# Patient Record
Sex: Male | Born: 1975 | Race: White | Hispanic: No | Marital: Married | Smoking: Never smoker
Health system: Southern US, Community
[De-identification: ages and names within clinical notes are randomized; demographics above are authoritative.]

## PROBLEM LIST (undated history)

## (undated) DIAGNOSIS — K219 Gastro-esophageal reflux disease without esophagitis: Secondary | ICD-10-CM

## (undated) DIAGNOSIS — J45909 Unspecified asthma, uncomplicated: Secondary | ICD-10-CM

## (undated) HISTORY — PX: OTHER SURGICAL HISTORY: SHX169

## (undated) HISTORY — PX: NASAL SINUS SURGERY: SHX719

## (undated) HISTORY — DX: Gastro-esophageal reflux disease without esophagitis: K21.9

## (undated) HISTORY — PX: SINUS SURGERY WITH INSTATRAK: SHX5215

---

## 2018-05-03 ENCOUNTER — Emergency Department: Payer: BLUE CROSS/BLUE SHIELD

## 2018-05-03 ENCOUNTER — Emergency Department
Admission: EM | Admit: 2018-05-03 | Discharge: 2018-05-03 | Disposition: A | Discharge status: Home / Self Care | Payer: BLUE CROSS/BLUE SHIELD | Attending: Emergency Medicine | Admitting: Emergency Medicine

## 2018-05-03 ENCOUNTER — Encounter: Payer: Self-pay | Admitting: Emergency Medicine

## 2018-05-03 DIAGNOSIS — M79661 Pain in right lower leg: Secondary | ICD-10-CM

## 2018-05-03 DIAGNOSIS — Y9289 Other specified places as the place of occurrence of the external cause: Secondary | ICD-10-CM | POA: Insufficient documentation

## 2018-05-03 DIAGNOSIS — T148XXA Other injury of unspecified body region, initial encounter: Secondary | ICD-10-CM | POA: Diagnosis not present

## 2018-05-03 DIAGNOSIS — X509XXA Other and unspecified overexertion or strenuous movements or postures, initial encounter: Secondary | ICD-10-CM | POA: Insufficient documentation

## 2018-05-03 DIAGNOSIS — Y9301 Activity, walking, marching and hiking: Secondary | ICD-10-CM | POA: Diagnosis not present

## 2018-05-03 DIAGNOSIS — Y998 Other external cause status: Secondary | ICD-10-CM | POA: Diagnosis not present

## 2018-05-03 DIAGNOSIS — M79604 Pain in right leg: Secondary | ICD-10-CM | POA: Diagnosis not present

## 2018-05-03 DIAGNOSIS — J45909 Unspecified asthma, uncomplicated: Secondary | ICD-10-CM | POA: Diagnosis not present

## 2018-05-03 HISTORY — DX: Unspecified asthma, uncomplicated: J45.909

## 2018-05-03 NOTE — Discharge Instructions (Addendum)
Your exam and ultrasound are essentially normal at this time. Your symptoms likely represent a calf muscle strain and a quad muscle  injury. There is no evidence of blood clot (DVT). You may experience muscle cramps for a few days more. Take OTC ibuprofen or naproxen for muscle pain relief. Follow-up with [facility] for ongoing symptoms.

## 2018-05-03 NOTE — ED Triage Notes (Signed)
Patient presents to the ED with cramping to his right thigh last night.  Patient states he is concerned he could have a blood clot.  Patient states he has not had any pain or cramping today.  Patient denies swelling or discoloration in his leg.  Patient denies any history of blood clots.

## 2018-05-03 NOTE — ED Provider Notes (Signed)
[facility] Emergency Department Provider Note ____________________________________________  Time seen: 0300  I have reviewed the triage vital signs and the nursing notes.  HISTORY  Chief Complaint  Leg Pain  HPI Jason Petersen is a 43 y.o. male presents to the ED from [facility]. He has a 2-week complaint of intermittent right calf pain and cramping. He reports a minor injury at onset, when he stepped over a shallow creek, while holding his toddler. He denies a fall, but describes a mild strain to the calf. He got concerned after he developed some muscle pain at the medial quadriceps muscle on the same leg. He presented to [facility] for initial evaluation. He denies any chest pain, shortness of breath, leg pain, edema, or claudication.   Past Medical History:  Diagnosis Date  . Asthma     There are no active problems to display for this patient.   Past Surgical History:  Procedure Laterality Date  . SINUS SURGERY WITH INSTATRAK      Prior to Admission medications   Not on File    Allergies Patient has no known allergies.  No family history on file.  Social History Social History   Tobacco Use  . Smoking status: Never Smoker  . Smokeless tobacco: Never Used  Substance Use Topics  . Alcohol use: Yes    Comment: occasionally  . Drug use: Not on file    Review of Systems  Constitutional: Negative for fever. Cardiovascular: Negative for chest pain. Respiratory: Negative for shortness of breath. Gastrointestinal: Negative for abdominal pain, vomiting and diarrhea. Genitourinary: Negative for dysuria. Musculoskeletal: Negative for back pain. RLE pain as above Skin: Negative for rash. Neurological: Negative for headaches, focal weakness or numbness. ____________________________________________  PHYSICAL EXAM:  VITAL SIGNS: ED Triage Vitals [05/03/18 1102]  Enc Vitals Group     BP (!) 157/82     Pulse Rate 92     Resp 18     Temp 98.9 F (37.2  C)     Temp Source Oral     SpO2 96 %     Weight 250 lb (113.4 kg)     Height 5\' 11"  (1.803 m)     Head Circumference      Peak Flow      Pain Score 0     Pain Loc      Pain Edu?      Excl. in [place]?     Constitutional: Alert and oriented. Well appearing and in no distress. Head: Normocephalic and atraumatic. Eyes: Conjunctivae are normal. Normal extraocular movements Cardiovascular: Normal rate, regular rhythm. Normal distal pulses. No cyanosis, clubbing, or edema noted distally Respiratory: Normal respiratory effort. No wheezes/rales/rhonchi. Musculoskeletal: RLE without edema, ecchymosis, or skin temp changes. Minimal calf tenderness at the achilles origin. No palpable muscle defects. Nontender with normal range of motion in all extremities.  Neurologic:  CN II-XII grossly intact. Normal gait without ataxia. Normal speech and language. No gross focal neurologic deficits are appreciated. Skin:  Skin is warm, dry and intact. No rash noted. Psychiatric: Mood and affect are normal. Patient exhibits appropriate insight and judgment. ____________________________________________   RADIOLOGY  RLE US Doppler  IMPRESSION: No evidence of right lower extremity deep venous thrombosis. ____________________________________________  PROCEDURES  Procedures ____________________________________________  INITIAL IMPRESSION / ASSESSMENT AND PLAN / ED COURSE  Patient with ED evaluation of RLE pain s/p minor mechanical injury. He is reassured by his negative doppler study. He is discharged to select & follow-up with a primary care  provider for routine medical care. Return precautions have been reviewed.  ____________________________________________  FINAL CLINICAL IMPRESSION(S) / ED DIAGNOSES  Final diagnoses:  Right leg pain  Right calf pain  Muscle strain     [name], PA-C 05/03/18 [place], [place], MD 05/04/18 [phone]

## 2018-05-03 NOTE — ED Notes (Signed)
See triage note  Presents with pain to right leg  States he developed cramping type pain to lower leg   And then moved to upper leg unsure of injury but states he did step wrong about 1-1/2 weeks ago  But did not fall  Denies any pain while sitting at present

## 2018-10-22 ENCOUNTER — Other Ambulatory Visit: Payer: Self-pay | Admitting: Family Medicine

## 2018-10-22 ENCOUNTER — Other Ambulatory Visit: Payer: Self-pay | Admitting: Student

## 2018-10-22 DIAGNOSIS — N5089 Other specified disorders of the male genital organs: Secondary | ICD-10-CM

## 2018-10-30 ENCOUNTER — Other Ambulatory Visit: Payer: Self-pay

## 2018-10-30 ENCOUNTER — Ambulatory Visit
Admission: RE | Admit: 2018-10-30 | Discharge: 2018-10-30 | Disposition: A | Discharge status: Home / Self Care | Payer: BC Managed Care – PPO | Source: Ambulatory Visit | Attending: Family Medicine | Admitting: Family Medicine

## 2018-10-30 DIAGNOSIS — N5089 Other specified disorders of the male genital organs: Secondary | ICD-10-CM | POA: Diagnosis present

## 2018-11-12 DIAGNOSIS — K219 Gastro-esophageal reflux disease without esophagitis: Secondary | ICD-10-CM | POA: Insufficient documentation

## 2018-11-12 DIAGNOSIS — R81 Glycosuria: Secondary | ICD-10-CM | POA: Insufficient documentation

## 2018-11-12 DIAGNOSIS — J4530 Mild persistent asthma, uncomplicated: Secondary | ICD-10-CM | POA: Insufficient documentation

## 2019-02-15 ENCOUNTER — Ambulatory Visit: Payer: BC Managed Care – PPO | Admitting: Urology

## 2019-02-22 ENCOUNTER — Other Ambulatory Visit: Payer: Self-pay | Admitting: Family Medicine

## 2019-02-22 ENCOUNTER — Other Ambulatory Visit: Payer: Self-pay

## 2019-02-22 ENCOUNTER — Ambulatory Visit (INDEPENDENT_AMBULATORY_CARE_PROVIDER_SITE_OTHER): Payer: Self-pay | Admitting: Urology

## 2019-02-22 ENCOUNTER — Other Ambulatory Visit
Admission: RE | Admit: 2019-02-22 | Discharge: 2019-02-22 | Disposition: A | Discharge status: Home / Self Care | Payer: Self-pay | Attending: Urology | Admitting: Urology

## 2019-02-22 ENCOUNTER — Encounter: Payer: Self-pay | Admitting: Urology

## 2019-02-22 VITALS — BP 141/84 | HR 74 | Ht 71.0 in | Wt 226.0 lb

## 2019-02-22 DIAGNOSIS — N411 Chronic prostatitis: Secondary | ICD-10-CM

## 2019-02-22 DIAGNOSIS — N4889 Other specified disorders of penis: Secondary | ICD-10-CM

## 2019-02-22 DIAGNOSIS — N644 Mastodynia: Secondary | ICD-10-CM

## 2019-02-22 LAB — URINALYSIS, COMPLETE (UACMP) WITH MICROSCOPIC
Bilirubin Urine: NEGATIVE
Glucose, UA: NEGATIVE mg/dL
Hgb urine dipstick: NEGATIVE
Ketones, ur: NEGATIVE mg/dL
Leukocytes,Ua: NEGATIVE
Nitrite: NEGATIVE
Protein, ur: NEGATIVE mg/dL
RBC / HPF: NONE SEEN RBC/hpf (ref 0–5)
Specific Gravity, Urine: 1.01 (ref 1.005–1.030)
pH: 5.5 (ref 5.0–8.0)

## 2019-02-22 MED ORDER — TAMSULOSIN HCL 0.4 MG PO CAPS: 0.4000 mg | ORAL_CAPSULE | Freq: Every day | ORAL | 2 refills | Status: DC

## 2019-02-22 MED ORDER — TAMSULOSIN HCL 0.4 MG PO CAPS: 0.4000 mg | ORAL_CAPSULE | Freq: Every day | ORAL | 0 refills | Status: DC

## 2019-02-22 NOTE — Progress Notes (Signed)
02/22/2019 8:41 AM   Jason Petersen [birth year] [id number]  Referring provider: Derinda Late, MD [phone] [address] [facility] [facility] [place],  [place] [id number]  No chief complaint on file.   HPI: 44 year old male who presents today for further evaluation of possible prostatitis and right pelvic pain.  He reports that about two months ago, he developed fairly significant right groin pain radiating down his inguinal area to his right perineum.  He also has some discomfort radiating to his hip.  The pain waxed and waned but never completely resolved.  No alleviating or exacerbating factors.  He had a scrotal ultrasound in December which is unremarkable other than a very small right hydrocele.  He has had several urinalyses all of which have been completely negative.  He was recently seen and evaluated in urgent care and prescribed Cipro which he has been taking twice daily for a total of 30 days.  He has 1 or 2 more doses of this.  He reports overall, his pain is improving.  He does still have some residual dull ache in his perineum but this is markedly improved.  He has no associated urinary symptoms at this time.  He does mention today that he had what he feels like some trauma with intercourse and has some discomfort with erections and penetration.  During the traumatic intercourse, he denied any bruising or swelling, loss of erection.  He has no penile curvature.  He wonders if this may have been an inciting event to his perineal discomfort.   Notably, he is also being evaluated for back pain.  He recently had x-rays for this.  He was prescribed meloxicam but has not yet started this medication.  He denies any numbness, weakness, or pain radiating down his thighs.   PMH: Past Medical History:  Diagnosis Date  . Asthma     Surgical History: Past Surgical History:  Procedure Laterality Date  . NASAL SINUS SURGERY    . SINUS SURGERY WITH  INSTATRAK    . tubs in ears      Home Medications:  Allergies as of 02/22/2019   No Known Allergies     Medication List       Accurate as of February 22, 2019 11:59 PM. If you have any questions, ask your nurse or doctor.        beclomethasone 80 MCG/ACT inhaler Commonly known as: QVAR Inhale into the lungs.   ciprofloxacin 500 MG tablet Commonly known as: CIPRO Take by mouth.   meloxicam 15 MG tablet Commonly known as: MOBIC Take 15 mg by mouth daily.   omeprazole 40 MG capsule Commonly known as: PRILOSEC Take by mouth.   tamsulosin 0.4 MG Caps capsule Commonly known as: Flomax Take 1 capsule (0.4 mg total) by mouth daily. Started by: [name], MD   tamsulosin 0.4 MG Caps capsule Commonly known as: Flomax Take 1 capsule (0.4 mg total) by mouth daily. Started by: [name], MD       Allergies: No Known Allergies  Family History: No family history on file.  Social History:  reports that he has never smoked. He has never used smokeless tobacco. He reports current alcohol use. No history on file for drug.  ROS: UROLOGY Frequent Urination?: Yes Hard to postpone urination?: No Burning/pain with urination?: No Get up at night to urinate?: No Leakage of urine?: No Urine stream starts and stops?: No Trouble starting stream?: No Do you have to strain to  urinate?: No Blood in urine?: No Urinary tract infection?: No Sexually transmitted disease?: No Injury to kidneys or bladder?: No Painful intercourse?: Yes Weak stream?: No Erection problems?: Yes Penile pain?: No  Gastrointestinal Nausea?: No Vomiting?: No Indigestion/heartburn?: No Diarrhea?: No Constipation?: No  Constitutional Fever: No Night sweats?: No Weight loss?: No Fatigue?: No  Skin Skin rash/lesions?: No Itching?: No  Eyes Blurred vision?: No Double vision?: No  Ears/Nose/Throat Sore throat?: No Sinus problems?: No  Hematologic/Lymphatic Swollen glands?:  No Easy bruising?: No  Cardiovascular Leg swelling?: No Chest pain?: No  Respiratory Cough?: No Shortness of breath?: No  Endocrine Excessive thirst?: No  Musculoskeletal Back pain?: No Joint pain?: No  Neurological Headaches?: No Dizziness?: No  Psychologic Depression?: No Anxiety?: No  Physical Exam: BP (!) 141/84   Pulse 74   Ht 5\' 11"  (1.803 m)   Wt 226 lb (102.5 kg)   BMI 31.52 kg/m   Constitutional:  Alert and oriented, No acute distress. HEENT: [place] AT, moist mucus membranes.  Trachea midline, no masses. Cardiovascular: No clubbing, cyanosis, or edema. Respiratory: Normal respiratory effort, no increased work of breathing. GI: Abdomen is soft, nontender, nondistended, no abdominal masses.  No evidence on inguinal hernia with valsalva when examined in standing position. GU: Normal circumcised phallus, bilateral descended testicles without masses, nontender.   Rectal: Normal sphincter tone, 20 cc prostate, nontender, no masses Skin: No rashes, bruises or suspicious lesions. Neurologic: Grossly intact, no focal deficits, moving all 4 extremities. Psychiatric: Normal mood and affect.  Urinalysis    Component Value Date/Time   COLORURINE YELLOW 02/22/2019 1008   APPEARANCEUR CLEAR 02/22/2019 1008   LABSPEC 1.010 02/22/2019 1008   PHURINE 5.5 02/22/2019 1008   GLUCOSEU NEGATIVE 02/22/2019 1008   HGBUR NEGATIVE 02/22/2019 1008   BILIRUBINUR NEGATIVE 02/22/2019 1008   KETONESUR NEGATIVE 02/22/2019 1008   PROTEINUR NEGATIVE 02/22/2019 1008   NITRITE NEGATIVE 02/22/2019 1008   LEUKOCYTESUR NEGATIVE 02/22/2019 1008    Lab Results  Component Value Date   BACTERIA FEW (A) 02/22/2019    Pertinent Imaging: CLINICAL DATA:  RIGHT testicular lump for 1 week  EXAM: SCROTAL ULTRASOUND  DOPPLER ULTRASOUND OF THE TESTICLES  TECHNIQUE: Complete ultrasound examination of the testicles, epididymis, and other scrotal structures was performed. Color and  spectral Doppler ultrasound were also utilized to evaluate blood flow to the testicles.  COMPARISON:  None  FINDINGS: Right testicle  Measurements: 4.2 x 2.4 x 2.8 cm. Normal echogenicity without mass or calcification. Internal blood flow present on color Doppler imaging.  Left testicle  Measurements: 4.3 x 2.3 x 2.8 cm. Normal morphology without mass or calcification. Internal blood flow present on color Doppler imaging, symmetric with RIGHT.  Right epididymis:  Normal in size and appearance.  Left epididymis:  Normal in size and appearance.  Hydrocele:  Very small RIGHT hydrocele.  No LEFT hydrocele.  Varicocele:  None visualized.  Pulsed Doppler interrogation of both testes demonstrates normal low resistance arterial and venous waveforms bilaterally.  IMPRESSION: Very small RIGHT hydrocele.  Otherwise normal exam.   Electronically Signed   By: Lavonia Dana M.D.   On: 10/30/2018 14:14  Scrotal ultrasound personally reviewed, agree with radiologic interpretetion  Assessment & Plan:    1. Chronic prostatitis Improving symptoms s/p cipro x 4 weeks  Recommend continued supportive care  Patient would likely benefit from initiation of meloxican as prescribed by PCP along with short course of Flomax until his symptoms have fully resolved  Although his pain laterize to  the right, no other neurological symptoms therefore do no suspect neuropathy, however, may pursue further eval if symptoms worsen or recur   UA reassuring - Urinalysis, Complete; Future  2. Penile pain No evidence of penile trauma or nodules, reassuring     [name], MD  [facility] [address] [place], [place] [address] [phone]

## 2019-03-04 ENCOUNTER — Ambulatory Visit: Payer: BC Managed Care – PPO | Attending: Internal Medicine

## 2019-03-04 DIAGNOSIS — Z20822 Contact with and (suspected) exposure to covid-19: Secondary | ICD-10-CM

## 2019-03-05 ENCOUNTER — Ambulatory Visit
Admission: RE | Admit: 2019-03-05 | Discharge: 2019-03-05 | Disposition: A | Discharge status: Home / Self Care | Payer: BC Managed Care – PPO | Source: Ambulatory Visit | Attending: Family Medicine | Admitting: Family Medicine

## 2019-03-05 DIAGNOSIS — N644 Mastodynia: Secondary | ICD-10-CM | POA: Diagnosis present

## 2019-03-05 LAB — NOVEL CORONAVIRUS, NAA: SARS-CoV-2, NAA: NOT DETECTED

## 2019-03-13 ENCOUNTER — Ambulatory Visit: Payer: BC Managed Care – PPO | Attending: Internal Medicine

## 2019-03-13 DIAGNOSIS — Z20822 Contact with and (suspected) exposure to covid-19: Secondary | ICD-10-CM

## 2019-03-14 LAB — NOVEL CORONAVIRUS, NAA: SARS-CoV-2, NAA: NOT DETECTED

## 2019-04-16 ENCOUNTER — Ambulatory Visit (INDEPENDENT_AMBULATORY_CARE_PROVIDER_SITE_OTHER): Payer: BC Managed Care – PPO | Admitting: Physician Assistant

## 2019-04-16 ENCOUNTER — Other Ambulatory Visit: Payer: Self-pay

## 2019-04-16 ENCOUNTER — Ambulatory Visit: Payer: Medicaid Other | Admitting: Urology

## 2019-04-16 ENCOUNTER — Encounter: Payer: Self-pay | Admitting: Physician Assistant

## 2019-04-16 VITALS — BP 129/84 | HR 73 | Ht 71.0 in | Wt 225.0 lb

## 2019-04-16 DIAGNOSIS — G8929 Other chronic pain: Secondary | ICD-10-CM

## 2019-04-16 DIAGNOSIS — R1031 Right lower quadrant pain: Secondary | ICD-10-CM | POA: Diagnosis not present

## 2019-04-16 LAB — URINALYSIS, COMPLETE
Bilirubin, UA: NEGATIVE
Glucose, UA: NEGATIVE
Ketones, UA: NEGATIVE
Leukocytes,UA: NEGATIVE
Nitrite, UA: NEGATIVE
Protein,UA: NEGATIVE
Specific Gravity, UA: 1.015 (ref 1.005–1.030)
Urobilinogen, Ur: 0.2 mg/dL (ref 0.2–1.0)
pH, UA: 5 (ref 5.0–7.5)

## 2019-04-16 LAB — MICROSCOPIC EXAMINATION
Bacteria, UA: NONE SEEN
Epithelial Cells (non renal): NONE SEEN /hpf (ref 0–10)

## 2019-04-16 NOTE — Patient Instructions (Signed)
Pelvic Pain, Male  Pelvic pain is pain in your lower abdomen, below your belly button and between your hips. The pain may start suddenly (be acute), keep coming back (recur), or last a long time (become chronic). Pelvic pain that lasts longer than six months is considered chronic. There are many possible causes of pelvic pain. Sometimes, the cause is not known.  Pelvic pain may affect your:  · Prostate gland.  · Urinary system.  · Digestive tract.  · Musculoskeletal system. Strained muscles or ligaments may cause pelvic pain.  Follow these instructions at home:    Medicines  · Take over-the-counter and prescription medicines only as told by your health care provider.  · If you were prescribed an antibiotic medicine, take it as told by your health care provider. Do not stop taking the antibiotic even if you start to feel better.  Managing pain, stiffness, and swelling    · Take warm water baths (sitz baths). Sitz baths help with relaxing your pelvic floor muscles.  ? For a sitz bath, the water only comes up to your hips and covers your buttocks. A sitz bath may done at home in a bathtub or with a portable sitz bath that fits over the toilet.  · If directed, apply heat to the affected area before you exercise. Use the heat source that your health care provider recommends, such as a moist heat pack or a heating pad.  ? Place a towel between your skin and the heat source.  ? Leave the heat on for 20-30 minutes.  ? Remove the heat if your skin turns bright red. This is especially important if you are unable to feel pain, heat, or cold. You may have a greater risk of getting burned.  General instructions  · Rest as told by your health care provider.  · Keep a journal of your pelvic pain. Write down:  ? When the pain started.  ? Where the pain is located.  ? What seems to make the pain better or worse.  ? Any symptoms you have along with the pain.  · Follow your treatment plan as told by your health care provider. This may  include:  ? Pelvic physical therapy.  ? Yoga, meditation, and exercise.  ? Biofeedback. This process trains you to manage your body's response (physiological response) through breathing techniques and relaxation methods. You will work with a therapist while machines are used to monitor your physical symptoms.  ? Acupuncture. This is a type of treatment that involves stimulating specific points on your body by inserting thin needles through your skin to treat pain.  · Keep all follow-up visits as told by your health care provider. This is important.  Contact a health care provider if:  · Medicine does not help your pain.  · Your pain comes back.  · You have new symptoms.  · You have a fever or chills.  · You are constipated.  · You have blood in your urine or stool.  · You feel weak or light-headed.  Get help right away if:  · You have sudden severe pain.  · Your pain steadily gets worse.  · You have severe pain along with fever, nausea, vomiting, or excessive sweating.  Summary  · Pelvic pain is pain in your lower abdomen, below your belly button and between your hips. There are many possible causes of pelvic pain. Sometimes, the cause is not known.  · Take over-the-counter and prescription medicines only as told   Get help right away if you have severe pain along with fever, nausea, vomiting, or excessive sweating.  Keep all follow-up visits as told by your health care provider. This is important. This information is not intended to replace advice given to you by your health care provider. Make sure you discuss any questions you have with your health care provider. Document Revised: 06/07/2017 Document Reviewed: 06/07/2017 Elsevier Patient Education   2020 [employer]vier Inc.  

## 2019-04-16 NOTE — Progress Notes (Signed)
04/16/2019 10:03 AM   [name] Jul 03, 1975 [id number]  CC: Right groin/perineal pain  HPI: Jason Petersen is a 44 y.o. male who presents today for reevaluation of right groin and perineal pain.  He was first evaluated for this by Dr. Erlene Quan on 02/22/2019.  At that time, he reported that his symptoms had slightly improved on ciprofloxacin x30 days.  He had also recently been prescribed meloxicam, however he had not started taking it yet.  Scrotal ultrasound with Doppler on 10/30/2018 revealed a small right hydrocele with no other significant findings.  Dr. Erlene Quan recommended continued supportive care, meloxicam, and Flomax 0.4 mg daily x1 month.  Today, he reports taking meloxicam and Flomax daily for 1 month following his most recent visit but does not believe that they have improved his symptoms.  He continues to report constant right groin and perineal pain that is waxing and waning in nature.  It fluctuates in intensity, however the quality has not changed.  He states his current symptoms represent a slight improvement since starting ciprofloxacin.  Additionally, he does have some minor pain with urination and pain after sex.  Lastly, he is concerned for recent yeast infection that may have ascended into his urinary tract.  He denies penile or scrotal trauma, edema, discharge, and gross hematuria.  In-office UA today positive for trace-intact blood; urine microscopy pan negative.  PMH: Past Medical History:  Diagnosis Date  . Asthma     Surgical History: Past Surgical History:  Procedure Laterality Date  . NASAL SINUS SURGERY    . SINUS SURGERY WITH INSTATRAK    . tubs in ears      Home Medications:  Allergies as of 04/16/2019   No Known Allergies     Medication List       Accurate as of April 16, 2019 10:03 AM. If you have any questions, ask your nurse or doctor.        beclomethasone 80 MCG/ACT inhaler Commonly known as: QVAR Inhale into the lungs.    ciprofloxacin 500 MG tablet Commonly known as: CIPRO Take by mouth.   meloxicam 15 MG tablet Commonly known as: MOBIC Take 15 mg by mouth daily.   omeprazole 40 MG capsule Commonly known as: PRILOSEC Take by mouth.   tamsulosin 0.4 MG Caps capsule Commonly known as: Flomax Take 1 capsule (0.4 mg total) by mouth daily.   tamsulosin 0.4 MG Caps capsule Commonly known as: Flomax Take 1 capsule (0.4 mg total) by mouth daily.       Allergies:  No Known Allergies  Family History: No family history on file.  Social History:   reports that he has never smoked. He has never used smokeless tobacco. He reports current alcohol use. No history on file for drug.  Physical Exam: BP 129/84   Pulse 73   Ht 5\' 11"  (1.803 m)   Wt 225 lb (102.1 kg)   BMI 31.38 kg/m   Constitutional:  Alert and oriented, no acute distress, nontoxic appearing HEENT: [place], AT Cardiovascular: No clubbing, cyanosis, or edema Respiratory: Normal respiratory effort, no increased work of breathing GU: Circumcised penis.  Meatal stenosis.  Penis without plaques, lesions, discharge, or rashes.  No hernias noted.  Bilateral descended testes with intact vasa. Skin: No rashes, bruises or suspicious lesions Neurologic: Grossly intact, no focal deficits, moving all 4 extremities Psychiatric: Normal mood and affect  Laboratory Data: Results for orders placed or performed in visit on 04/16/19  Microscopic Examination   URINE  Result Value Ref Range   WBC, UA 0-5 0 - 5 /hpf   RBC 0-2 0 - 2 /hpf   Epithelial Cells (non renal) None seen 0 - 10 /hpf   Bacteria, UA None seen None seen/Few  Urinalysis, Complete  Result Value Ref Range   Specific Gravity, UA 1.015 1.005 - 1.030   pH, UA 5.0 5.0 - 7.5   Color, UA Yellow Yellow   Appearance Ur Clear Clear   Leukocytes,UA Negative Negative   Protein,UA Negative Negative/Trace   Glucose, UA Negative Negative   Ketones, UA Negative Negative   RBC, UA Trace (A)  Negative   Bilirubin, UA Negative Negative   Urobilinogen, Ur 0.2 0.2 - 1.0 mg/dL   Nitrite, UA Negative Negative   Microscopic Examination See below:    Assessment & Plan:   1. Chronic groin pain, right 44 year old male with persistent right groin and perineal pain despite completing treatment for a possible chronic prostatitis flare.  He reports mild symptom improvement that he attributes primarily to ciprofloxacin and does not believe the meloxicam or Flomax helped.  UA reassuring for infection.  We will defer repeat scrotal ultrasound given chronicity of symptoms with negative ultrasound in December 2020.  In light of his persistent symptoms, I am recommending a referral to pelvic floor PT for further evaluation.  I explained that pelvic PT can frequently improve chronic pelvic pain.  Patient expressed understanding.  Counseled patient to follow-up with [place] as needed based on symptom improvement with PT. - Urinalysis, Complete - Ambulatory referral to Physical Therapy   Return if symptoms worsen or fail to improve.  Debroah Loop, PA-C  [facility] [address] [place], [place] [address] [phone]

## 2019-04-18 ENCOUNTER — Encounter: Payer: Self-pay | Admitting: Gastroenterology

## 2019-04-18 ENCOUNTER — Other Ambulatory Visit: Payer: Self-pay

## 2019-04-18 ENCOUNTER — Ambulatory Visit (INDEPENDENT_AMBULATORY_CARE_PROVIDER_SITE_OTHER): Payer: BC Managed Care – PPO | Admitting: Gastroenterology

## 2019-04-18 VITALS — BP 120/76 | HR 66 | Temp 98.3°F | Ht 71.0 in | Wt 225.8 lb

## 2019-04-18 DIAGNOSIS — K219 Gastro-esophageal reflux disease without esophagitis: Secondary | ICD-10-CM

## 2019-04-18 DIAGNOSIS — K921 Melena: Secondary | ICD-10-CM | POA: Diagnosis not present

## 2019-04-18 NOTE — Progress Notes (Signed)
Gastroenterology Consultation  Referring Provider:     Clyde Canterbury, MD Primary Care Physician:  Derinda Late, MD Primary Gastroenterologist:  Dr. Allen Norris     Reason for Consultation:     GERD        HPI:   Jason Petersen is a 44 y.o. y/o male referred for consultation & management of GERD by Dr. Derinda Late, MD.  This patient comes in today after being seen by Dr. Richardson Landry at ENT for reflux symptoms.  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  The patient was taking omeprazole.  The symptoms are constant.  The patient reports that he had been working out and he felt some sort of ripping or tearing in his epigastric area and is not sure if those symptoms are contributing to his present issues.  The patient states that he has been having some right-sided discomfort with muscle cramps and also reports some blurred vision and other mild changes in his overall wellbeing that he thinks may be side effects of the omeprazole.  His symptoms have not improved dramatically and he still reports not feeling well.  The patient reports that he has a strong family history of medical issues on the male side of his family with brain cancers and ovarian cancers but is not aware of any issues with GI track issues.  The patient also comes in with pictures on his phone of stools with blood in it that he states has happened 5-6 times.  He denies any blood on the toilet paper except for 1 or 2 times.  He does report that he has had some intermittent constipation diarrhea.  He reports that when he eats he feels like there is a crunching noise in the back of his throat and if he eats a certain way he can avoid that crunching sound from happening.  Past Medical History:  Diagnosis Date  . Asthma     Past Surgical History:  Procedure Laterality Date  . NASAL SINUS SURGERY    . SINUS SURGERY WITH INSTATRAK    . tubs in ears       Prior to Admission medications   Medication Sig Start Date End Date Taking? Authorizing Provider  beclomethasone (QVAR) 80 MCG/ACT inhaler Inhale into the lungs. 11/12/18 11/12/19  [provider]  ciprofloxacin (CIPRO) 500 MG tablet Take by mouth. 02/08/19   [provider]  meloxicam (MOBIC) 15 MG tablet Take 15 mg by mouth daily. 02/14/19   [provider]  omeprazole (PRILOSEC) 40 MG capsule Take by mouth.    [provider]  tamsulosin (FLOMAX) 0.4 MG CAPS capsule Take 1 capsule (0.4 mg total) by mouth daily. 02/22/19   [name], MD  tamsulosin (FLOMAX) 0.4 MG CAPS capsule Take 1 capsule (0.4 mg total) by mouth daily. 02/22/19   [name], MD    No family history on file.   Social History   Tobacco Use  . Smoking status: Never Smoker  . Smokeless tobacco: Never Used  Substance Use Topics  . Alcohol use: Yes    Comment: occasionally  . Drug use: Not on file    Allergies as of 04/18/2019  . (No Known Allergies)    Review of Systems:    All systems reviewed and negative except where noted in HPI.   Physical Exam:  There were no vitals taken for this visit. No LMP for male patient. General:  Alert,  Well-developed, well-nourished, pleasant and cooperative in NAD Head:  Normocephalic and atraumatic. Eyes:  Sclera clear, no icterus.   Conjunctiva pink. Ears:  Normal auditory acuity. Neck:  Supple; no masses or thyromegaly. Lungs:  Respirations even and unlabored.  Clear throughout to auscultation.   No wheezes, crackles, or rhonchi. No acute distress. Heart:  Regular rate and rhythm; no murmurs, clicks, rubs, or gallops. Abdomen:  Normal bowel sounds.  No bruits.  Soft, non-tender and non-distended without masses, hepatosplenomegaly or hernias noted.  No guarding or rebound tenderness.  Negative Carnett sign.   Rectal:  Deferred.  Pulses:  Normal pulses noted. Extremities:  No clubbing or edema.  No cyanosis. Neurologic:   Alert and oriented x3;  grossly normal neurologically. Skin:  Intact without significant lesions or rashes.  No jaundice. Lymph Nodes:  No significant cervical adenopathy. Psych:  Alert and cooperative. Normal mood and affect.  Imaging Studies: No results found.  Assessment and Plan:   [name] is a 44 y.o. y/o male who comes in today with symptoms of possible gastroesophageal reflux with some side effects from his omeprazole and rectal bleeding.  The patient will be switched to pantoprazole to see if the symptoms are a class effect or just the omeprazole.  If he does continue to have symptoms he may need to be switched over to Pepcid for acid reflux.  In the meantime the patient will also be set up for an EGD and colonoscopy to look for a cause of his rectal bleeding and his problems with reflux and swallowing. I have discussed risks & benefits which include, but are not limited to, bleeding, infection, perforation & drug reaction.  The patient agrees with this plan & written consent will be obtained.       Lucilla Lame, MD. Marval Regal    Note: This dictation was prepared with Dragon dictation along with smaller phrase technology. Any transcriptional errors that result from this process are unintentional.

## 2019-04-18 NOTE — H&P (View-Only) (Signed)
Gastroenterology Consultation  Referring Provider:     Clyde Canterbury, MD Primary Care Physician:  Derinda Late, MD Primary Gastroenterologist:  Dr. Allen Norris     Reason for Consultation:     GERD        HPI:   Jason Petersen is a 44 y.o. y/o male referred for consultation & management of GERD by Dr. Derinda Late, MD.  This patient comes in today after being seen by Dr. Richardson Landry at ENT for reflux symptoms.  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  The patient was taking omeprazole.  The symptoms are constant.  The patient reports that he had been working out and he felt some sort of ripping or tearing in his epigastric area and is not sure if those symptoms are contributing to his present issues.  The patient states that he has been having some right-sided discomfort with muscle cramps and also reports some blurred vision and other mild changes in his overall wellbeing that he thinks may be side effects of the omeprazole.  His symptoms have not improved dramatically and he still reports not feeling well.  The patient reports that he has a strong family history of medical issues on the male side of his family with brain cancers and ovarian cancers but is not aware of any issues with GI track issues.  The patient also comes in with pictures on his phone of stools with blood in it that he states has happened 5-6 times.  He denies any blood on the toilet paper except for 1 or 2 times.  He does report that he has had some intermittent constipation diarrhea.  He reports that when he eats he feels like there is a crunching noise in the back of his throat and if he eats a certain way he can avoid that crunching sound from happening.  Past Medical History:  Diagnosis Date  . Asthma     Past Surgical History:  Procedure Laterality Date  . NASAL SINUS SURGERY    . SINUS SURGERY WITH INSTATRAK    . tubs in ears       Prior to Admission medications   Medication Sig Start Date End Date Taking? Authorizing Provider  beclomethasone (QVAR) 80 MCG/ACT inhaler Inhale into the lungs. 11/12/18 11/12/19  [provider]  ciprofloxacin (CIPRO) 500 MG tablet Take by mouth. 02/08/19   [provider]  meloxicam (MOBIC) 15 MG tablet Take 15 mg by mouth daily. 02/14/19   [provider]  omeprazole (PRILOSEC) 40 MG capsule Take by mouth.    [provider]  tamsulosin (FLOMAX) 0.4 MG CAPS capsule Take 1 capsule (0.4 mg total) by mouth daily. 02/22/19   [name], MD  tamsulosin (FLOMAX) 0.4 MG CAPS capsule Take 1 capsule (0.4 mg total) by mouth daily. 02/22/19   [name], MD    No family history on file.   Social History   Tobacco Use  . Smoking status: Never Smoker  . Smokeless tobacco: Never Used  Substance Use Topics  . Alcohol use: Yes    Comment: occasionally  . Drug use: Not on file    Allergies as of 04/18/2019  . (No Known Allergies)    Review of Systems:    All systems reviewed and negative except where noted in HPI.   Physical Exam:  There were no vitals taken for this visit. No LMP for male patient. General:  Alert,  Well-developed, well-nourished, pleasant and cooperative in NAD Head:  Normocephalic and atraumatic. Eyes:  Sclera clear, no icterus.   Conjunctiva pink. Ears:  Normal auditory acuity. Neck:  Supple; no masses or thyromegaly. Lungs:  Respirations even and unlabored.  Clear throughout to auscultation.   No wheezes, crackles, or rhonchi. No acute distress. Heart:  Regular rate and rhythm; no murmurs, clicks, rubs, or gallops. Abdomen:  Normal bowel sounds.  No bruits.  Soft, non-tender and non-distended without masses, hepatosplenomegaly or hernias noted.  No guarding or rebound tenderness.  Negative Carnett sign.   Rectal:  Deferred.  Pulses:  Normal pulses noted. Extremities:  No clubbing or edema.  No cyanosis. Neurologic:   Alert and oriented x3;  grossly normal neurologically. Skin:  Intact without significant lesions or rashes.  No jaundice. Lymph Nodes:  No significant cervical adenopathy. Psych:  Alert and cooperative. Normal mood and affect.  Imaging Studies: No results found.  Assessment and Plan:   Jason Petersen is a 44 y.o. y/o male who comes in today with symptoms of possible gastroesophageal reflux with some side effects from his omeprazole and rectal bleeding.  The patient will be switched to pantoprazole to see if the symptoms are a class effect or just the omeprazole.  If he does continue to have symptoms he may need to be switched over to Pepcid for acid reflux.  In the meantime the patient will also be set up for an EGD and colonoscopy to look for a cause of his rectal bleeding and his problems with reflux and swallowing. I have discussed risks & benefits which include, but are not limited to, bleeding, infection, perforation & drug reaction.  The patient agrees with this plan & written consent will be obtained.       Lucilla Lame, MD. Marval Regal    Note: This dictation was prepared with Dragon dictation along with smaller phrase technology. Any transcriptional errors that result from this process are unintentional.

## 2019-04-19 ENCOUNTER — Other Ambulatory Visit: Payer: Self-pay

## 2019-04-19 DIAGNOSIS — K219 Gastro-esophageal reflux disease without esophagitis: Secondary | ICD-10-CM

## 2019-04-19 DIAGNOSIS — K921 Melena: Secondary | ICD-10-CM

## 2019-04-22 ENCOUNTER — Telehealth: Payer: Self-pay | Admitting: Gastroenterology

## 2019-04-22 ENCOUNTER — Other Ambulatory Visit: Payer: Self-pay

## 2019-04-22 MED ORDER — PANTOPRAZOLE SODIUM 40 MG PO TBEC: 40.0000 mg | DELAYED_RELEASE_TABLET | Freq: Every day | ORAL | 6 refills | Status: AC

## 2019-04-22 NOTE — Telephone Encounter (Signed)
Pt is calling he saw dr. Allen Norris last week and was supposed to get a medication PPI to replace Omeprazole he does not see it in his medication list please call pt

## 2019-04-23 NOTE — Telephone Encounter (Signed)
Pantoprazole has been sent to pt's pharmacy.

## 2019-05-08 ENCOUNTER — Other Ambulatory Visit
Admission: RE | Admit: 2019-05-08 | Discharge: 2019-05-08 | Disposition: A | Discharge status: Home / Self Care | Payer: BC Managed Care – PPO | Source: Ambulatory Visit | Attending: Gastroenterology | Admitting: Gastroenterology

## 2019-05-08 DIAGNOSIS — Z01812 Encounter for preprocedural laboratory examination: Secondary | ICD-10-CM | POA: Insufficient documentation

## 2019-05-08 DIAGNOSIS — Z20822 Contact with and (suspected) exposure to covid-19: Secondary | ICD-10-CM | POA: Diagnosis not present

## 2019-05-08 LAB — SARS CORONAVIRUS 2 (TAT 6-24 HRS): SARS Coronavirus 2: NEGATIVE

## 2019-05-10 ENCOUNTER — Ambulatory Visit: Payer: BC Managed Care – PPO | Admitting: Anesthesiology

## 2019-05-10 ENCOUNTER — Ambulatory Visit
Admission: RE | Admit: 2019-05-10 | Discharge: 2019-05-10 | Disposition: A | Discharge status: Home / Self Care | Payer: BC Managed Care – PPO | Attending: Gastroenterology | Admitting: Gastroenterology

## 2019-05-10 ENCOUNTER — Encounter: Payer: Self-pay | Admitting: Gastroenterology

## 2019-05-10 ENCOUNTER — Encounter
Admission: RE | Disposition: A | Discharge status: Home / Self Care | Payer: Self-pay | Source: Home / Self Care | Attending: Gastroenterology

## 2019-05-10 ENCOUNTER — Other Ambulatory Visit: Payer: Self-pay

## 2019-05-10 DIAGNOSIS — Z8041 Family history of malignant neoplasm of ovary: Secondary | ICD-10-CM | POA: Diagnosis not present

## 2019-05-10 DIAGNOSIS — Z79899 Other long term (current) drug therapy: Secondary | ICD-10-CM | POA: Diagnosis not present

## 2019-05-10 DIAGNOSIS — K641 Second degree hemorrhoids: Secondary | ICD-10-CM | POA: Diagnosis not present

## 2019-05-10 DIAGNOSIS — J45909 Unspecified asthma, uncomplicated: Secondary | ICD-10-CM | POA: Insufficient documentation

## 2019-05-10 DIAGNOSIS — Z808 Family history of malignant neoplasm of other organs or systems: Secondary | ICD-10-CM | POA: Diagnosis not present

## 2019-05-10 DIAGNOSIS — K219 Gastro-esophageal reflux disease without esophagitis: Secondary | ICD-10-CM | POA: Insufficient documentation

## 2019-05-10 DIAGNOSIS — K921 Melena: Secondary | ICD-10-CM | POA: Diagnosis not present

## 2019-05-10 DIAGNOSIS — D122 Benign neoplasm of ascending colon: Secondary | ICD-10-CM | POA: Insufficient documentation

## 2019-05-10 DIAGNOSIS — R12 Heartburn: Secondary | ICD-10-CM | POA: Diagnosis not present

## 2019-05-10 DIAGNOSIS — D125 Benign neoplasm of sigmoid colon: Secondary | ICD-10-CM | POA: Insufficient documentation

## 2019-05-10 DIAGNOSIS — K635 Polyp of colon: Secondary | ICD-10-CM

## 2019-05-10 HISTORY — PX: ESOPHAGOGASTRODUODENOSCOPY (EGD) WITH PROPOFOL: SHX5813

## 2019-05-10 HISTORY — PX: COLONOSCOPY WITH PROPOFOL: SHX5780

## 2019-05-10 SURGERY — COLONOSCOPY WITH PROPOFOL
Anesthesia: General

## 2019-05-10 MED ORDER — LIDOCAINE HCL (CARDIAC) PF 100 MG/5ML IV SOSY
PREFILLED_SYRINGE | INTRAVENOUS | Status: DC | PRN
  Administered 2019-05-10: 100 mg via INTRATRACHEAL

## 2019-05-10 MED ORDER — ESMOLOL HCL 100 MG/10ML IV SOLN
INTRAVENOUS | Status: DC | PRN
  Administered 2019-05-10: 10 mg via INTRAVENOUS

## 2019-05-10 MED ORDER — PROPOFOL 500 MG/50ML IV EMUL
INTRAVENOUS | Status: DC | PRN
  Administered 2019-05-10: 165 ug/kg/min via INTRAVENOUS

## 2019-05-10 MED ORDER — PROPOFOL 10 MG/ML IV BOLUS
INTRAVENOUS | Status: DC | PRN
  Administered 2019-05-10: 20 mg via INTRAVENOUS
  Administered 2019-05-10: 60 mg via INTRAVENOUS
  Administered 2019-05-10: 20 mg via INTRAVENOUS

## 2019-05-10 MED ORDER — SODIUM CHLORIDE 0.9 % IV SOLN: INTRAVENOUS | Status: DC | PRN

## 2019-05-10 MED ORDER — GLYCOPYRROLATE 0.2 MG/ML IJ SOLN
INTRAMUSCULAR | Status: DC | PRN
  Administered 2019-05-10: .2 mg via INTRAVENOUS

## 2019-05-10 NOTE — Op Note (Addendum)
[facility] Gastroenterology Patient Name: Jason Petersen Procedure Date: 05/10/2019 10:42 AM MRN: [id number] Account #: [id number] Date of Birth: 1975-09-28 Admit Type: Outpatient Age: 44 Room: [facility] ENDO ROOM 4 Gender: Male Note Status: Finalized Procedure:             Upper GI endoscopy Indications:           Heartburn Providers:             Lucilla Lame MD, MD Referring MD:          Caprice Renshaw MD (Referring MD) Medicines:             Propofol per Anesthesia Complications:         No immediate complications. Procedure:             Pre-Anesthesia Assessment:                        - Prior to the procedure, a History and Physical was                         performed, and patient medications and allergies were                         reviewed. The patient's tolerance of previous                         anesthesia was also reviewed. The risks and benefits                         of the procedure and the sedation options and risks                         were discussed with the patient. All questions were                         answered, and informed consent was obtained. Prior                         Anticoagulants: The patient has taken no previous                         anticoagulant or antiplatelet agents. ASA Grade                         Assessment: II - A patient with mild systemic disease.                         After reviewing the risks and benefits, the patient                         was deemed in satisfactory condition to undergo the                         procedure.                        After obtaining informed consent, the endoscope was  passed under direct vision. Throughout the procedure,                         the patient's blood pressure, pulse, and oxygen                         saturations were monitored continuously. The Endoscope                         was introduced through the mouth, and advanced to the                         second part of duodenum. The upper GI endoscopy was                         accomplished without difficulty. The patient tolerated                         the procedure well. Findings:      The esophagus was normal.      The stomach was normal.      The examined duodenum was normal. Impression:            - Normal esophagus.                        - Normal stomach.                        - Normal examined duodenum.                        - No specimens collected. Recommendation:        - Discharge patient to home.                        - Resume previous diet.                        - Continue present medications.                        - Perform a colonoscopy today. Procedure Code(s):     --- Professional ---                        [phone], Esophagogastroduodenoscopy, flexible,                         transoral; diagnostic, including collection of                         specimen(s) by brushing or washing, when performed                         (separate procedure) Diagnosis Code(s):     --- Professional ---                        R12, Heartburn CPT copyright 2019 American Medical Association. All rights reserved. The codes documented in this report are preliminary and upon coder review may  be revised to meet current compliance requirements. Lucilla Lame MD, MD 05/10/2019  10:58:00 AM This report has been signed electronically. Number of Addenda: 0 Note Initiated On: 05/10/2019 10:42 AM Estimated Blood Loss:  Estimated blood loss: none.      [facility]

## 2019-05-10 NOTE — Anesthesia Postprocedure Evaluation (Signed)
Anesthesia Post Note  Patient: Jason Petersen  Procedure(s) Performed: COLONOSCOPY WITH PROPOFOL (N/A ) ESOPHAGOGASTRODUODENOSCOPY (EGD) WITH PROPOFOL (N/A )  Patient location during evaluation: PACU Anesthesia Type: General Level of consciousness: awake and alert and oriented Pain management: pain level controlled Vital Signs Assessment: post-procedure vital signs reviewed and stable Respiratory status: spontaneous breathing Cardiovascular status: blood pressure returned to baseline Anesthetic complications: no     Last Vitals:  Vitals:   05/10/19 1005 05/10/19 1118  BP: 138/83   Pulse: 94   Resp: 20   Temp: 36.4 C (!) 36.4 C  SpO2: 99%     Last Pain:  Vitals:   05/10/19 1128  TempSrc:   PainSc: 0-No pain                 Merwyn Hodapp

## 2019-05-10 NOTE — Op Note (Addendum)
[facility] Gastroenterology Patient Name: Jason Petersen Procedure Date: 05/10/2019 10:41 AM MRN: [id number] Account #: [id number] Date of Birth: [birth year] Admit Type: Outpatient Age: 44 Room: [facility] ENDO ROOM 4 Gender: Male Note Status: Supervisor Override Procedure:             Colonoscopy Indications:           Hematochezia Providers:             Lucilla Lame MD, MD Referring MD:          Caprice Renshaw MD (Referring MD) Medicines:             Propofol per Anesthesia Complications:         No immediate complications. Procedure:             Pre-Anesthesia Assessment:                        - Prior to the procedure, a History and Physical was                         performed, and patient medications and allergies were                         reviewed. The patient's tolerance of previous                         anesthesia was also reviewed. The risks and benefits                         of the procedure and the sedation options and risks                         were discussed with the patient. All questions were                         answered, and informed consent was obtained. Prior                         Anticoagulants: The patient has taken no previous                         anticoagulant or antiplatelet agents. ASA Grade                         Assessment: II - A patient with mild systemic disease.                         After reviewing the risks and benefits, the patient                         was deemed in satisfactory condition to undergo the                         procedure.                        After obtaining informed consent, the colonoscope was  passed under direct vision. Throughout the procedure,                         the patient's blood pressure, pulse, and oxygen                         saturations were monitored continuously. The                         Colonoscope was introduced through the anus and                 advanced to the the cecum, identified by appendiceal                         orifice and ileocecal valve. The colonoscopy was                         performed without difficulty. The patient tolerated                         the procedure well. The quality of the bowel                         preparation was excellent. Findings:      The perianal and digital rectal examinations were normal.      A 9 mm polyp was found in the sigmoid colon. The polyp was pedunculated.       The polyp was removed with a hot snare. Resection and retrieval were       complete. To prevent bleeding post-intervention, one hemostatic clip was       successfully placed (MR conditional). There was no bleeding at the end       of the procedure.      Non-bleeding internal hemorrhoids were found during retroflexion. The       hemorrhoids were Grade II (internal hemorrhoids that prolapse but reduce       spontaneously).      A 5 mm polyp was found in the ascending colon. The polyp was sessile.       The polyp was removed with a cold snare. Resection and retrieval were       complete. Impression:            - One 9 mm polyp in the sigmoid colon, removed with a                         hot snare. Resected and retrieved. Clip (MR                         conditional) was placed.                        - Non-bleeding internal hemorrhoids.                        - One 5 mm polyp in the ascending colon, removed with                         a cold snare. Resected and retrieved. Recommendation:        - Discharge patient to home.                        -  Resume previous diet.                        - Continue present medications.                        - Await pathology results. Procedure Code(s):     --- Professional ---                        [phone], Colonoscopy, flexible; with removal of                         tumor(s), polyp(s), or other lesion(s) by snare                         technique Diagnosis  Code(s):     --- Professional ---                        K92.1, Melena (includes Hematochezia)                        K63.5, Polyp of colon CPT copyright 2019 American Medical Association. All rights reserved. The codes documented in this report are preliminary and upon coder review may  be revised to meet current compliance requirements. Lucilla Lame MD, MD 05/10/2019 11:17:28 AM This report has been signed electronically. Number of Addenda: 0 Note Initiated On: 05/10/2019 10:41 AM Scope Withdrawal Time: 0 hours 7 minutes 43 seconds  Total Procedure Duration: 0 hours 13 minutes 28 seconds  Estimated Blood Loss:  Estimated blood loss: none.      [facility]

## 2019-05-10 NOTE — Interval H&P Note (Signed)
History and Physical Interval Note:  05/10/2019 10:13 AM  Jason Petersen  has presented today for surgery, with the diagnosis of GERD K21.9 Hematochezia K92.1.  The various methods of treatment have been discussed with the patient and family. After consideration of risks, benefits and other options for treatment, the patient has consented to  Procedure(s): COLONOSCOPY WITH PROPOFOL (N/A) ESOPHAGOGASTRODUODENOSCOPY (EGD) WITH PROPOFOL (N/A) as a surgical intervention.  The patient's history has been reviewed, patient examined, no change in status, stable for surgery.  I have reviewed the patient's chart and labs.  Questions were answered to the patient's satisfaction.     Jason Petersen

## 2019-05-10 NOTE — Anesthesia Preprocedure Evaluation (Signed)
Anesthesia Evaluation  Patient identified by MRN, date of birth, ID band Patient awake    Reviewed: Allergy & Precautions, NPO status , Patient's Chart, lab work & pertinent test results  Airway Mallampati: II  TM Distance: >3 FB     Dental  (+) Teeth Intact   Pulmonary asthma ,    Pulmonary exam normal        Cardiovascular negative cardio ROS Normal cardiovascular exam     Neuro/Psych negative neurological ROS  negative psych ROS   GI/Hepatic Neg liver ROS, GERD  ,  Endo/Other  negative endocrine ROS  Renal/GU negative Renal ROS  negative genitourinary   Musculoskeletal negative musculoskeletal ROS (+)   Abdominal Normal abdominal exam  (+)   Peds negative pediatric ROS (+)  Hematology negative hematology ROS (+)   Anesthesia Other Findings   Reproductive/Obstetrics                             Anesthesia Physical Anesthesia Plan  ASA: II  Anesthesia Plan: General   Post-op Pain Management:    Induction: Intravenous  PONV Risk Score and Plan: Propofol infusion  Airway Management Planned: Nasal Cannula  Additional Equipment:   Intra-op Plan:   Post-operative Plan:   Informed Consent: I have reviewed the patients History and Physical, chart, labs and discussed the procedure including the risks, benefits and alternatives for the proposed anesthesia with the patient or authorized representative who has indicated his/her understanding and acceptance.     Dental advisory given  Plan Discussed with: CRNA and Surgeon  Anesthesia Plan Comments:         Anesthesia Quick Evaluation

## 2019-05-10 NOTE — Transfer of Care (Signed)
Immediate Anesthesia Transfer of Care Note  Patient: [name]  Procedure(s) Performed: COLONOSCOPY WITH PROPOFOL (N/A ) ESOPHAGOGASTRODUODENOSCOPY (EGD) WITH PROPOFOL (N/A )  Patient Location: Endoscopy Unit  Anesthesia Type:General  Level of Consciousness: drowsy, patient cooperative and responds to stimulation  Airway & Oxygen Therapy: Patient Spontanous Breathing and Patient connected to face mask oxygen  Post-op Assessment: Report given to RN and Post -op Vital signs reviewed and stable  Post vital signs: Reviewed and stable  Last Vitals:  Vitals Value Taken Time  BP 116/80 05/10/19 1118  Temp    Pulse 86 05/10/19 1118  Resp 14 05/10/19 1118  SpO2 100 % 05/10/19 1118  Vitals shown include unvalidated device data.  Last Pain:  Vitals:   05/10/19 1005  TempSrc: Temporal  PainSc: 0-No pain         Complications: No apparent anesthesia complications

## 2019-05-13 ENCOUNTER — Encounter: Payer: Self-pay | Admitting: Gastroenterology

## 2019-05-13 LAB — SURGICAL PATHOLOGY

## 2019-05-15 ENCOUNTER — Other Ambulatory Visit: Payer: Self-pay | Admitting: Sports Medicine

## 2019-05-15 DIAGNOSIS — M25561 Pain in right knee: Secondary | ICD-10-CM

## 2019-05-15 DIAGNOSIS — M25461 Effusion, right knee: Secondary | ICD-10-CM

## 2019-05-21 ENCOUNTER — Other Ambulatory Visit: Payer: Self-pay

## 2019-05-21 ENCOUNTER — Ambulatory Visit
Admission: RE | Admit: 2019-05-21 | Discharge: 2019-05-21 | Disposition: A | Discharge status: Home / Self Care | Payer: BC Managed Care – PPO | Source: Ambulatory Visit | Attending: Sports Medicine | Admitting: Sports Medicine

## 2019-05-21 DIAGNOSIS — M25561 Pain in right knee: Secondary | ICD-10-CM

## 2019-05-21 DIAGNOSIS — M25461 Effusion, right knee: Secondary | ICD-10-CM | POA: Diagnosis present

## 2019-05-23 ENCOUNTER — Other Ambulatory Visit: Payer: Self-pay

## 2019-05-23 ENCOUNTER — Ambulatory Visit: Payer: BC Managed Care – PPO | Attending: Physician Assistant | Admitting: Physical Therapy

## 2019-05-23 ENCOUNTER — Encounter: Payer: Self-pay | Admitting: Physical Therapy

## 2019-05-23 DIAGNOSIS — R102 Pelvic and perineal pain: Secondary | ICD-10-CM | POA: Diagnosis present

## 2019-05-23 DIAGNOSIS — M5441 Lumbago with sciatica, right side: Secondary | ICD-10-CM | POA: Diagnosis present

## 2019-05-23 DIAGNOSIS — M62838 Other muscle spasm: Secondary | ICD-10-CM | POA: Diagnosis present

## 2019-05-23 DIAGNOSIS — M25561 Pain in right knee: Secondary | ICD-10-CM | POA: Diagnosis present

## 2019-05-23 DIAGNOSIS — R2689 Other abnormalities of gait and mobility: Secondary | ICD-10-CM | POA: Diagnosis present

## 2019-05-23 DIAGNOSIS — G8929 Other chronic pain: Secondary | ICD-10-CM | POA: Insufficient documentation

## 2019-05-23 NOTE — Patient Instructions (Signed)
  Avoid straining pelvic floor, abdominal muscles , spine  Use log rolling technique instead of getting out of bed with your neck or the sit-up     Log rolling into and out of bed   Log rolling into and out of bed If getting out of bed on R side, Bent knees, scoot hips/ shoulder to L  Raise R arm completely overhead, rolling onto armpit  Then lower bent knees to bed to get into complete side lying position  Then drop legs off bed, and push up onto R elbow/forearm, and use L hand to push onto the bed   ___  Bear stretch to L side  3x am and pm   ___  L only     Lengthen Back rib by _ shoulder    Lie on   side , pillow between knees  Pull  arm overhead over mattress, grab the edge of mattress,pull it upward, drawing elbow away from ears  Breathing   Open book (handout)  Lying on  _ side , rotating  __ only this week   ___  Stand on both legs and not on one side

## 2019-05-24 NOTE — Therapy (Addendum)
[place] MAIN [facility] SERVICES [address] [place], [place], [address] Phone: [phone]   Fax:  [phone]  Physical Therapy Evaluation  Patient Details  Name: Jason Petersen MRN: [id number] Date of Birth: [birth year] Referring Provider (PT): Vaillancourt   Encounter Date: 05/23/2019    Past Medical History:  Diagnosis Date  . Asthma     Past Surgical History:  Procedure Laterality Date  . COLONOSCOPY WITH PROPOFOL N/A 05/10/2019   Procedure: COLONOSCOPY WITH PROPOFOL;  Surgeon: Lucilla Lame, MD;  Location: [facility] ENDOSCOPY;  Service: Endoscopy;  Laterality: N/A;  . ESOPHAGOGASTRODUODENOSCOPY (EGD) WITH PROPOFOL N/A 05/10/2019   Procedure: ESOPHAGOGASTRODUODENOSCOPY (EGD) WITH PROPOFOL;  Surgeon: Lucilla Lame, MD;  Location: [facility] ENDOSCOPY;  Service: Endoscopy;  Laterality: N/A;  . NASAL SINUS SURGERY    . SINUS SURGERY WITH INSTATRAK    . tubs in ears      There were no vitals filed for this visit.   Subjective Assessment - 05/23/19 1512    Subjective 1) Chronic pain between scrotum to anus that started this summer Jul-Aug 2020 which started as a pulling pain slowly without known injury. Pain occured squatting, standing from sitting.  Quick shooting pain when it occurs 3-4/10.  Pain faded quickly once movement is complete. It has worsened since than in Oct-Nov 2020 with gradual increased to 6-7/10 pain with sitting now. Pt has to sit with a large cushion to be comfortable.  Chairs at work are hard.  Pain no longer hurts with squat and sit to stand. Pt went through antibiotics and Molixicam and the pain got worse after he completed the medications.  The point of pain below R scrotum as an pinching ache with no particular activity nor movement that cuases it. Sometimes it occurs with standing. walking and sitting. Urinary frequency 1-2 x every 2 hours with the onset of this pain. Pt has a constant sensation of feeling like he is constipated.   Bowel movements occur daily to every other time. Straining is needed to complete bowel movements 50-75% of the time. 2) Penile Pain 7/10 occurs with erections and pain eases to almost no pain after a full day after masturbation or sexual activity.  After this penile pain subsides, the scrotum to anus persists for another additional 1-2 days .  This penile pain and scrotal pain started together and then got worse after one incident during sexual activity with wife when a certain position she was in caused the penis to bend quickly but the penis returned to its central alignment. After this incident, the L middle side of the penile was swollen and it is still there when he gets an erection. Ejaculation has not been affected by the scrotal and penile pain.    2) R hip pain/ LBP  started in Dec 2020 suddenly and most of the pain is in"deep in the R butt"  with some pinching feeling in teh front of the hips. 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  Pt sits most of his work day. For one week , there was shooting pain to the R posterior knee from LBP.  It is no longer constant after he took time off from walking/workout  to rest due to knee pain.  It comes back from time to time. Now LBP/ R hip pain 2-3/10 and increases to 6/10 when flares up and pt can not describe associated activity.  Pt got a  tick bite in the same spot in his back in June ( a month before the onset of the pelvic pain) . Pt went ER to check for muscle spasms but ER staff did not suspect Lymes disease because he did not have the Sx of Lymes   3) R knee pain started one month ago when pt took a step and his knee went out from under him but he did not fall. The next day, the knee felt internally swollen "like a gel pack" that was painful 8/10. The pain radiated upward to the R butt in the same pattern as the radiating pain from LBP/ R hip/ butt area down to the back of the knee. Pt had R  numbness and tingling/ burning pain in middle 3 toes but when the knee pain went away, the foot pain went away.  MRI for R knee 05/22/19.  Pt stopped walking and his workroutine. Pt started walking again in the past weeks with 1-2 /10  knee pain.     Pertinent History    Physical activity: walking 1.5 miles atleast 4 x week, 30 min on elliptical,  dumbbells 20 lbs each hand:  arm / chest standing with feet side by side.  Pt performed sit up and crunches during June and July and felt a "pop like a muscle folding onto another"  in the abdomen by diaphragm. Hernia was r/o by GI. GERD started after this incident. Pt stopped doing sit up/ crunches. 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         [facility] PT Assessment - 06/03/19 1037      Assessment   Medical Diagnosis  pelvic pain     Referring Provider (PT)  Vaillancourt      Precautions   Precautions  None      Restrictions   Weight Bearing Restrictions  No      Balance Screen   Has the patient fallen in the past 6 months  No      Prior Function   Level of Independence  Independent      Observation/Other Assessments   Observations  stance position w/ decreased weight on R     Scoliosis  present, details below       AROM   Overall AROM Comments  R sideflexion limited ( improved post Tx)        Palpation   Spinal mobility  L convex lumbar, hypomobility T12/L2 .       SI assessment   L iliac crest higher ( leg length longer 2 cm than R ),        Bed Mobility   Bed Mobility  --   crunch technique               Objective measurements completed on examination: See above findings.    Pelvic Floor Special Questions - 06/03/19 1036    Diastasis Recti  abdominal separation       [facility] Adult PT Treatment/Exercise - 06/03/19 1037      Neuro Re-ed    Neuro Re-ed Details   cued  for log rolling to minimize abdominal straining      Exercises   Exercises  --   see pt instructions to offset repeated R turns at work  Manual Therapy   Manual therapy comments  STM/MWM at T12-L2 to promote more L trunk rotation             PT Long Term Goals - 05/23/19 1518      PT LONG TERM GOAL #1   Title  Pt will report no pinching ache at  the point below R scrotum, decreased penile pain from 7/10 to < 2/10 after sexual activities    Time  8    Period  Weeks    Status  New    Target Date  07/11/19      PT LONG TERM GOAL #2   Title  Pt will report no flare up of R hip pain across 1 month and be IND with complimentary stretches to minimize overuse of back mm from repeated turning at work in order to return to walking and ADLs    Time  6    Period  Weeks    Status  New    Target Date  06/27/19      PT LONG TERM GOAL #3   Title  Pt report being able to sit for > 1 hour without relying on large cushion in order to resume community activities and be able to drive without pillow    Time  4    Period  Weeks    Status  New    Target Date  06/13/19      PT LONG TERM GOAL #4   Title  Pt will report no R knee pain nor radiating to buttocks pain across 2 weeks in order to perform ADLs and physical routine    Time  10    Period  Weeks    Status  New    Target Date  08/15/19      PT LONG TERM GOAL #5   Title  Pt will report no more straining with bowel movements 100% of the time, daily bowel movements instead of every other day in order to maintain pelvic health    Time  4    Period  Weeks    Status  New    Target Date  06/13/19      Additional Long Term Goals   Additional Long Term Goals  Yes      PT LONG TERM GOAL #6   Title  Pt will report less urinary urgency from 1-2 x every 2 hours to 1 x every 2 hours in order to work and attend community events    Time  6    Period  Weeks    Status  New    Target Date  06/27/19      PT LONG TERM GOAL #7   Title  Pt  will demo less abdominal separation and IND with deep core coordination and strengthening to optimize IAP system for all pelvic floor functions ( digestion, motility, bowel/ urninary function, sexual function)    Time  8    Period  Weeks    Status  New    Target Date  07/11/19              Plan - 06/03/19 1041    Clinical Impression Statement   Pt is a 44 yo  who presents with pelvic floor dsyfunctions that include pelvic/ penile pain, urinary frequency, straining with bowel movements. Pt also reports R hip/ LBP and R knee pain. These deficits impact his QOL, ADLs, and physical fitness routine. Pt's musculoskeletal assessment revealed pelvic misalignment, scoliotic curves,  limited spinal /pelvic mobility, dyscoordination and strength of pelvic floor mm, abdominal weakness, poor body mechanics which places strain on the abdominal/pelvic floor mm. These are deficits that indicate an ineffective intraabdominal pressure system associated with increased risk for pt's Sx.  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  Today, addressed spinal deviations with manual Tx which pt tolerated and provided HEP to minimize mm imbalance for work stretches and at home. Pt demo'd increased spinal mobility with R sideflexion and less mm tightness at L lumbar region. Suspect alignment issues will help address R knee / hip/ LBP.  Knee MRI was performed 05/22/19 and plan to f/u on results.    Pt will benefit from coordination training and education on fitness and functional positions in order to gain a more effective intraabdominal pressure system to minimize Sx.  Pt was provided education on etiology of Sx with anatomy, physiology explanation with images along  with the benefits of customized pelvic PT Tx based on pt's medical conditions and musculoskeletal deficits.  Explained the physiology of deep core mm coordination and roles of pelvic floor function in urination, defecation, sexual function, and postural control with deep core mm system.   Regional interdependent approaches will yield greater benefits in pt's POC due to the spinal and pelvic girdle alignment deficits and multi-regional Sx of pain.  Pt has tried antibiotics and medications for pelvic pain which has not made improvements to his Sx.   Pt benefits from skilled PT.        Examination-Activity Limitations  Squat;Bend;Sit    Stability/Clinical Decision Making  Evolving/Moderate complexity    Rehab Potential  Good    PT Frequency  1x / week    PT Duration  Other (comment)   10   PT Treatment/Interventions  Moist Heat;Therapeutic activities;Therapeutic exercise;Balance training;Neuromuscular re-education;Manual techniques;Patient/family education;Stair training;Energy conservation;Manual lymph drainage;Taping;Dry needling    Consulted and Agree with Plan of Care  Patient       Patient will benefit from skilled therapeutic intervention in order to improve the following deficits and impairments:  Decreased strength, Decreased balance, Postural dysfunction, Hypomobility, Impaired flexibility, Improper body mechanics, Pain, Decreased endurance, Decreased coordination, Abnormal gait, Difficulty walking, Increased muscle spasms, Decreased mobility, Impaired sensation, Decreased activity tolerance, Increased fascial restricitons, Decreased safety awareness  Visit Diagnosis: Acute pain of right knee  Chronic right-sided low back pain with right-sided sciatica  Pelvic pain     Problem List Patient Active Problem List   Diagnosis Date Noted  . Hematochezia   . Polyp of sigmoid colon   . Gastroesophageal reflux disease without esophagitis 11/12/2018  . Glucosuria 11/12/2018  . Mild  persistent asthma without complication 123XX123    Jerl Mina ,PT, DPT, E-RYT  06/03/2019, 10:42 AM  [place] MAIN [facility] SERVICES [address] [place], [place], [address] Phone: [phone]   Fax:  [phone]  Name: Renne Zoucha MRN: [id number] Date of Birth: [birth year]

## 2019-06-03 NOTE — Addendum Note (Signed)
Addended by: Jerl Mina on: 06/03/2019 11:05 AM   Modules accepted: Orders

## 2019-06-04 ENCOUNTER — Other Ambulatory Visit: Payer: Self-pay

## 2019-06-04 ENCOUNTER — Ambulatory Visit: Payer: BC Managed Care – PPO | Attending: Physician Assistant | Admitting: Physical Therapy

## 2019-06-04 DIAGNOSIS — M533 Sacrococcygeal disorders, not elsewhere classified: Secondary | ICD-10-CM | POA: Insufficient documentation

## 2019-06-04 DIAGNOSIS — R2689 Other abnormalities of gait and mobility: Secondary | ICD-10-CM | POA: Diagnosis present

## 2019-06-04 DIAGNOSIS — M25561 Pain in right knee: Secondary | ICD-10-CM | POA: Insufficient documentation

## 2019-06-04 DIAGNOSIS — M5441 Lumbago with sciatica, right side: Secondary | ICD-10-CM | POA: Diagnosis present

## 2019-06-04 DIAGNOSIS — R102 Pelvic and perineal pain: Secondary | ICD-10-CM | POA: Diagnosis present

## 2019-06-04 DIAGNOSIS — M62838 Other muscle spasm: Secondary | ICD-10-CM | POA: Insufficient documentation

## 2019-06-04 DIAGNOSIS — G8929 Other chronic pain: Secondary | ICD-10-CM | POA: Diagnosis present

## 2019-06-04 NOTE — Patient Instructions (Signed)
Keep up with work stretches to offset R preated turns   Take mid morning and afternoon breaks outside to attune to daylight for better sleep cycle       In the monring and night ( use as a wind down routine for sleep)    Do the stretches from last week on both sides   Add Deep core level 1 and 2 ( handout)   ___

## 2019-06-05 NOTE — Therapy (Addendum)
[place] MAIN [facility] SERVICES [address] [place], [place], [address] Phone: [phone]   Fax:  [phone]  Physical Therapy Treatment  Patient Details  Name: Larwence Duane MRN: [id number] Date of Birth: [birth year] Referring Provider (PT): Vaillancourt   Encounter Date: 06/04/2019    Past Medical History:  Diagnosis Date  . Asthma     Past Surgical History:  Procedure Laterality Date  . COLONOSCOPY WITH PROPOFOL N/A 05/10/2019   Procedure: COLONOSCOPY WITH PROPOFOL;  Surgeon: Lucilla Lame, MD;  Location: [facility] ENDOSCOPY;  Service: Endoscopy;  Laterality: N/A;  . ESOPHAGOGASTRODUODENOSCOPY (EGD) WITH PROPOFOL N/A 05/10/2019   Procedure: ESOPHAGOGASTRODUODENOSCOPY (EGD) WITH PROPOFOL;  Surgeon: Lucilla Lame, MD;  Location: [facility] ENDOSCOPY;  Service: Endoscopy;  Laterality: N/A;  . NASAL SINUS SURGERY    . SINUS SURGERY WITH INSTATRAK    . tubs in ears      There were no vitals filed for this visit.  Subjective Assessment - 06/04/19 0826    Subjective  Pt has been doing his exercises.  Pt reported last week, vertigo symptoms came on while standing like blurriness when looking at something close versus something far, eyes shaking.  Pt has had allergies in the past with similar Sx. Pt sleeps for 3-5 hours. Pt has a 43 year old and his wife is pregnant and whenever she wake up, he wakes up.  Pt voices he lacks discipline going to bed and he has worked night shift for several years.    Pt is thinking about getting his vision checked. Denied double vision.    Pertinent History  Physical activity: walking 1.5 miles atleast 4 x week, 30 min on elliptical,  dumbbells 20 lbs each hand:  arm / chest standing with feet side by side.  Pt performed sit up and crunches during June and July and felt a "pop like a muscle folding onto another"  in the abdomen by diaphragm. Hernai was r/o by GI. GERD started after this incident. Pt stopped doing sit up/ crunches. 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         [facility] PT Assessment - 06/04/19 1413      Coordination   Gross Motor Movements are Fluid and Coordinated  --   limited depression of anterior intercostals      Palpation   Spinal mobility  no lumbar curve/ deviation, no increased mm tensions at paraspinals     SI assessment                       [facility] Adult PT Treatment/Exercise - 06/04/19 1413      Therapeutic Activites    Therapeutic Activities  Other Therapeutic Activities    Other Therapeutic Activities  discussed sleep hygiene, Sx of stroke, f/u with neck issues      Neuro Re-ed    Neuro Re-ed Details   cued for deep core coordination and strengthening       Manual Therapy   Manual therapy comments  STM/MWM at anterior intercostals, fascial glide over abdomen to promote closure DRA                   PT Long Term Goals - 05/23/19 1518      PT LONG TERM GOAL #1  Title  Pt will report no pinching ache at  the point below R scrotum, decreased penile pain from 7/10 to < 2/10 after sexual activities    Time  8    Period  Weeks    Status  New    Target Date  07/11/19      PT LONG TERM GOAL #2   Title  Pt will report no flare up of R hip pain across 1 month and be IND with complimentary stretches to minimize overuse of back mm from repeated turning at work in order to return to walking and ADLs    Time  6    Period  Weeks    Status  New    Target Date  06/27/19      PT LONG TERM GOAL #3   Title  Pt report being able to sit for > 1 hour without relying on large cushion in order to resume community activities and be able to drive without pillow    Time  4    Period  Weeks    Status  New    Target Date  06/13/19      PT LONG TERM GOAL #4   Title  Pt will report no R knee pain nor radiating to buttocks  pain across 2 weeks in order to perform ADLs and physical routine    Time  10    Period  Weeks    Status  New    Target Date  08/15/19      PT LONG TERM GOAL #5   Title  Pt will report no more straining with bowel movements 100% of the time, daily bowel movements instead of every other day in order to maintain pelvic health    Time  4    Period  Weeks    Status  New    Target Date  06/13/19      Additional Long Term Goals   Additional Long Term Goals  Yes      PT LONG TERM GOAL #6   Title  Pt will report less urinary urgency from 1-2 x every 2 hours to 1 x every 2 hours in order to work and attend community events    Time  6    Period  Weeks    Status  New    Target Date  06/27/19      PT LONG TERM GOAL #7   Title  Pt will demo less abdominal separation and IND with deep core coordination and strengthening to optimize IAP system for all pelvic floor functions ( digestion, motility, bowel/ urninary function, sexual function)    Time  8    Period  Weeks    Status  New    Target Date  07/11/19            Plan - 06/05/19 1505    Clinical Impression Statement Pt arrived late to session and thus, session was abbreviated.   Pt remains compliant with stretches that counteract repeated R rotations at work which has created good carry over with decreased tightness along L lumbar mm.   Manual Tx was provided over abdominal wall to improve abdominal separation to increased IAP for less pelvic floor pain and improve posture.  Discussed sleep hygiene and role of sleep for chronic pain (pt reports sleeping 3-5 hours). Pt  report of neck, dizziness, vertigo issues at the beginning of session, stating they started last week and are similar to allergy symptoms. Pt was  screened for red flag Sx and thus, proceeded with PT session while also advised pt to f/u with doctors/ seek immediate medical attention if need.  Provided material and educated about Sx of stroke vs chest pain vs neck/  shoulder pain. Plan to monitor Sx and refer out for medical work up if Sx worsens.  Pt continues to benefit from skilled PT.      Examination-Activity Limitations  Squat;Bend;Sit    Stability/Clinical Decision Making  Evolving/Moderate complexity    Rehab Potential  Good    PT Frequency  1x / week    PT Duration  Other (comment)   10   PT Treatment/Interventions  Moist Heat;Therapeutic activities;Therapeutic exercise;Balance training;Neuromuscular re-education;Manual techniques;Patient/family education;Stair training;Energy conservation;Manual lymph drainage;Taping;Dry needling    Consulted and Agree with Plan of Care  Patient       Patient will benefit from skilled therapeutic intervention in order to improve the following deficits and impairments:  Decreased strength, Decreased balance, Postural dysfunction, Hypomobility, Impaired flexibility, Improper body mechanics, Pain, Decreased endurance, Decreased coordination, Abnormal gait, Difficulty walking, Increased muscle spasms, Decreased mobility, Impaired sensation, Decreased activity tolerance, Increased fascial restricitons, Decreased safety awareness  Visit Diagnosis: Other muscle spasm  Acute pain of right knee  Chronic right-sided low back pain with right-sided sciatica  Pelvic pain  Other abnormalities of gait and mobility     Problem List Patient Active Problem List   Diagnosis Date Noted  . Hematochezia   . Polyp of sigmoid colon   . Gastroesophageal reflux disease without esophagitis 11/12/2018  . Glucosuria 11/12/2018  . Mild persistent asthma without complication 123XX123    Jerl Mina ,PT, DPT, E-RYT  06/12/2019, 2:13 PM  [place] MAIN [facility] SERVICES [address] [place], [place], [address] Phone: [phone]   Fax:  [phone]  Name: Deshon Verhoeven MRN: [id number] Date of Birth: 06/21/1975

## 2019-06-11 ENCOUNTER — Other Ambulatory Visit: Payer: Self-pay

## 2019-06-11 ENCOUNTER — Ambulatory Visit: Payer: BC Managed Care – PPO | Admitting: Physical Therapy

## 2019-06-11 DIAGNOSIS — R102 Pelvic and perineal pain: Secondary | ICD-10-CM

## 2019-06-11 DIAGNOSIS — M62838 Other muscle spasm: Secondary | ICD-10-CM

## 2019-06-11 DIAGNOSIS — M25561 Pain in right knee: Secondary | ICD-10-CM

## 2019-06-11 DIAGNOSIS — M5441 Lumbago with sciatica, right side: Secondary | ICD-10-CM

## 2019-06-11 DIAGNOSIS — R2689 Other abnormalities of gait and mobility: Secondary | ICD-10-CM

## 2019-06-11 NOTE — Patient Instructions (Addendum)
Work stretches to minimize thoracic spine tightness   Back and back against the wall :   Angel wings 10 reps  By wall   Gentle chin up and chin tuck 10 reps    Elbow at 90 deg and shoulder press With wall squats in hale down  exhale stand ( engage in deep core )   20 reps   ___

## 2019-06-11 NOTE — Therapy (Signed)
[place] MAIN [facility] SERVICES [address] [place], [place], [address] Phone: [phone]   Fax:  [phone]  Physical Therapy Treatment  Patient Details  Name: Jason Petersen MRN: [id number] Date of Birth: 10/24/1975 Referring Provider (PT): Vaillancourt   Encounter Date: 06/11/2019  PT End of Session - 06/11/19 1515    Visit Number  3    Number of Visits  10    Date for PT Re-Evaluation  08/01/19    PT Start Time  0816    PT Stop Time  0900    PT Time Calculation (min)  44 min       Past Medical History:  Diagnosis Date  . Asthma     Past Surgical History:  Procedure Laterality Date  . COLONOSCOPY WITH PROPOFOL N/A 05/10/2019   Procedure: COLONOSCOPY WITH PROPOFOL;  Surgeon: Lucilla Lame, MD;  Location: [facility] ENDOSCOPY;  Service: Endoscopy;  Laterality: N/A;  . ESOPHAGOGASTRODUODENOSCOPY (EGD) WITH PROPOFOL N/A 05/10/2019   Procedure: ESOPHAGOGASTRODUODENOSCOPY (EGD) WITH PROPOFOL;  Surgeon: Lucilla Lame, MD;  Location: [facility] ENDOSCOPY;  Service: Endoscopy;  Laterality: N/A;  . NASAL SINUS SURGERY    . SINUS SURGERY WITH INSTATRAK    . tubs in ears      There were no vitals filed for this visit.  Subjective Assessment - 06/11/19 0822    Subjective  Pt reports his R neck pain goes from neck down back to shoulder when tilting head.  Pt also reports he needs to get his vision checked. Dizziness occurs cwhen looking at close to far objects. Tilting neck to the L has always been stiffer. Pre    Pertinent History  Physical activity: walking 1.5 miles atleast 4 x week, 30 min on elliptical,  dumbbells 20 lbs each hand:  arm / chest standing with feet side by side.  Pt performed sit up and crunches during June and July and felt a "pop like a muscle folding onto another"  in the abdomen by diaphragm. Hernai was r/o by GI. GERD started after this incident. Pt stopped doing sit up/ crunches. 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         [facility] PT Assessment - 06/12/19 1508      AROM   Overall AROM Comments  R sideflexion 35 deg ( post Tx: 45 deg) L 40 deg ( 50 deg post Tx) , L 35 deg rotation, R 40 deg seated cervical spine  ( post Tx: rotation 45 deg  )       Palpation   Spinal mobility  R shoulder lowered.     Palpation comment  L T1-3 hypomobile/ tender, interspinal mm/ medial scap mm tight  ( post Tx: improved)                    [facility] Adult PT Treatment/Exercise - 06/12/19 1508      Therapeutic Activites    Other Therapeutic Activities  telephoned pt's PCP 's office reporting of his Sx warranting an appointment . Appoint was set for 11 am that morning.       Neuro Re-ed    Neuro Re-ed Details   cued for cervical/ scapular retraction/ depression       Manual Therapy   Manual therapy comments  distraction at  C/T junction, L T1-3 medial glide , STM/MWM to promote more cervical/ upper thoracic extension. cervical retraction                   PT Long Term Goals - 05/23/19 1518      PT LONG TERM GOAL #1   Title  Pt will report no pinching ache at  the point below R scrotum, decreased penile pain from 7/10 to < 2/10 after sexual activities    Time  8    Period  Weeks    Status  New    Target Date  07/11/19      PT LONG TERM GOAL #2   Title  Pt will report no flare up of R hip pain across 1 month and be IND with complimentary stretches to minimize overuse of back mm from repeated turning at work in order to return to walking and ADLs    Time  6    Period  Weeks    Status  New    Target Date  06/27/19      PT LONG TERM GOAL #3   Title  Pt report being able to sit for > 1 hour without relying on large cushion in order to resume community activities and be able to drive without pillow    Time  4    Period  Weeks    Status  New    Target Date   06/13/19      PT LONG TERM GOAL #4   Title  Pt will report no R knee pain nor radiating to buttocks pain across 2 weeks in order to perform ADLs and physical routine    Time  10    Period  Weeks    Status  New    Target Date  08/15/19      PT LONG TERM GOAL #5   Title  Pt will report no more straining with bowel movements 100% of the time, daily bowel movements instead of every other day in order to maintain pelvic health    Time  4    Period  Weeks    Status  New    Target Date  06/13/19      Additional Long Term Goals   Additional Long Term Goals  Yes      PT LONG TERM GOAL #6   Title  Pt will report less urinary urgency from 1-2 x every 2 hours to 1 x every 2 hours in order to work and attend community events    Time  6    Period  Weeks    Status  New    Target Date  06/27/19      PT LONG TERM GOAL #7   Title  Pt will demo less abdominal separation and IND with deep core coordination and strengthening to optimize IAP system for all pelvic floor functions ( digestion, motility, bowel/ urninary function, sexual function)    Time  8    Period  Weeks    Status  New    Target Date  07/11/19            Plan - 06/11/19 0821    Clinical Impression Statement Pt arrived 16 min late. Abbreviated session.  Pt reported neck/ shoulder issues and vertigo/ dizziness persists and pt noticed it hurts in R armpit area with pushing.   Pt continued to not have any red flag Sx that deems necessity to go to ER  . Assessed  cervical/ thoracic segments and cervical AROM because pt demo'd spinal deviations with report of repeated movements to R and working as a Garment/textile technologist. Assessment showed  showed hypomobility and R cervical sideflexion, L rotation and tenderness with palpation over these areas. Post Tx, pt demo'd increased AROM and reported 50% less of his Sx at the end of the session. Added isometric strengthening exercises to promote thoracic extension/ cervical retraction, scapular  depression and cervical AROM to perform during work breaks. Anticipate these improvements will help offset looking through a microscope at work which is associated with forward head and poor posture and to offset his repeated R sided rotations of head and spine that is part of his work tasks.   Despite pt showing musculoskeletal improvements, therapist called PCP's office reporting of his Sx to get properly screened. Pt was worked into Dr. Elzie Rings schedule at 11 am the same morning.   Therapist followed up with pt on 06/12/19, day after appt and pt reported his vertigo and dizziness Sx improved by 80-90% after the PT treatment. Pt was prescribed medicine for his Sx by his provider. Pt also just received his 2nd COVID vaccine and is experiencing the side effects of the vaccine.  Pt was encouraged to follow up with his eye doctor as pt reported his vision blurred when focusing between close and far objects. Pt voiced he will wait.    Pt continues to benefit from skilled PT and continue to monitor his Sx and progress as appropriate. Plan to review deep core exercises.      Examination-Activity Limitations  Squat;Bend;Sit    Stability/Clinical Decision Making  Evolving/Moderate complexity    Rehab Potential  Good    PT Frequency  1x / week    PT Duration  Other (comment)   10   PT Treatment/Interventions  Moist Heat;Therapeutic activities;Therapeutic exercise;Balance training;Neuromuscular re-education;Manual techniques;Patient/family education;Stair training;Energy conservation;Manual lymph drainage;Taping;Dry needling    Consulted and Agree with Plan of Care  Patient       Patient will benefit from skilled therapeutic intervention in order to improve the following deficits and impairments:  Decreased strength, Decreased balance, Postural dysfunction, Hypomobility, Impaired flexibility, Improper body mechanics, Pain, Decreased endurance, Decreased coordination, Abnormal gait, Difficulty walking,  Increased muscle spasms, Decreased mobility, Impaired sensation, Decreased activity tolerance, Increased fascial restricitons, Decreased safety awareness  Visit Diagnosis: Acute pain of right knee  Chronic right-sided low back pain with right-sided sciatica  Pelvic pain  Other muscle spasm  Other abnormalities of gait and mobility     Problem List Patient Active Problem List   Diagnosis Date Noted  . Hematochezia   . Polyp of sigmoid colon   . Gastroesophageal reflux disease without esophagitis 11/12/2018  . Glucosuria 11/12/2018  . Mild persistent asthma without complication 123XX123    Jerl Mina ,PT, DPT, E-RYT  06/12/2019, 3:13 PM  [place] MAIN [facility] SERVICES [address] [place], [place], [address] Phone: [phone]   Fax:  [phone]  Name: Jason Petersen MRN: [id number] Date of Birth: 12-26-1975

## 2019-06-18 ENCOUNTER — Ambulatory Visit: Payer: BC Managed Care – PPO | Admitting: Physical Therapy

## 2019-06-18 ENCOUNTER — Other Ambulatory Visit: Payer: Self-pay

## 2019-06-18 DIAGNOSIS — M5441 Lumbago with sciatica, right side: Secondary | ICD-10-CM

## 2019-06-18 DIAGNOSIS — R102 Pelvic and perineal pain: Secondary | ICD-10-CM

## 2019-06-18 DIAGNOSIS — R2689 Other abnormalities of gait and mobility: Secondary | ICD-10-CM

## 2019-06-18 DIAGNOSIS — M25561 Pain in right knee: Secondary | ICD-10-CM

## 2019-06-18 DIAGNOSIS — M62838 Other muscle spasm: Secondary | ICD-10-CM

## 2019-06-18 NOTE — Therapy (Signed)
Jason Petersen [facility] SERVICES [address] [place], [place], [address] Phone: [phone]   Fax:  [phone]  Physical Therapy Treatment  Patient Details  Name: Jason Petersen MRN: [id number] Date of Birth: 07-Mar-1975 Referring Provider (PT): Vaillancourt   Encounter Date: 06/18/2019  PT End of Session - 06/18/19 0839    Visit Number  4    Number of Visits  10    Date for PT Re-Evaluation  08/01/19    PT Start Time  0820    PT Stop Time  0900    PT Time Calculation (min)  40 min       Past Medical History:  Diagnosis Date  . Asthma     Past Surgical History:  Procedure Laterality Date  . COLONOSCOPY WITH PROPOFOL N/A 05/10/2019   Procedure: COLONOSCOPY WITH PROPOFOL;  Surgeon: Lucilla Lame, MD;  Location: [facility] ENDOSCOPY;  Service: Endoscopy;  Laterality: N/A;  . ESOPHAGOGASTRODUODENOSCOPY (EGD) WITH PROPOFOL N/A 05/10/2019   Procedure: ESOPHAGOGASTRODUODENOSCOPY (EGD) WITH PROPOFOL;  Surgeon: Lucilla Lame, MD;  Location: [facility] ENDOSCOPY;  Service: Endoscopy;  Laterality: N/A;  . NASAL SINUS SURGERY    . SINUS SURGERY WITH INSTATRAK    . tubs in ears      There were no vitals filed for this visit.  Subjective Assessment - 06/18/19 0823    Subjective  Pt saw his PCP re: dizziness and was given motion sickness medication which did not help. Pt got his 2nd vaccine the same afternoon and has experienced side effects: extreme fatigue, muscle pain, "felt like every nerve was on fire", sinus pressure, kicked up his asthma, headaches.  Currently, the neck/ shoulder muscle pain he had prior to the vaccine  is better and now, there is a "pressure/ache" sensation at th area where he points to ( occiput).  His pelvic pain is better by 5% and he is doing his deep core exercise and work stretches.  His LBP has been resolved by 80% and he can now sleep on his side again.    Pertinent History  Physical activity: walking 1.5 miles atleast 4 x week, 30 min  on elliptical,  dumbbells 20 lbs each hand:  arm / chest standing with feet side by side.  Pt performed sit up and crunches during June and July and felt a "pop like a muscle folding onto another"  in the abdomen by diaphragm. Hernai was r/o by GI. GERD started after this incident. Pt stopped doing sit up/ crunches. 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         [facility] PT Assessment - 06/18/19 0856      Coordination   Gross Motor Movements are Fluid and Coordinated  --   cervical ext w/ rotations , lack scap retraction /depression     AROM   Overall AROM Comments  R rotation cervical 45 deg, L 50 deg,   L sideflexion 35, R 45 deg  ( post Tx: L sideflexion 45 deg)         Palpation   Spinal mobility  shoulders levelled     Palpation comment  L T3 deviated L, interspinals not as tight compared to last week, L T10 rib hypomobile  Pelvic Floor Special Questions - 06/18/19 0901    Diastasis Recti  1 finger width         [facility] Adult PT Treatment/Exercise - 06/18/19 ID:[id number]      Neuro Re-ed    Neuro Re-ed Details   cued for cervical retraction with review of HEP       Modalities   Modalities  Moist Heat      Moist Heat Therapy   Number Minutes Moist Heat  5 Minutes    Moist Heat Location  Cervical;Other (comment)      Manual Therapy   Manual therapy comments  rotational mob at T 10 intercostal space,  distraction/ STM/MWM at T 3 to promote L rotation/ sideflexion                   PT Long Term Goals - 05/23/19 1518      PT LONG TERM GOAL #1   Title  Pt will report no pinching ache at  the point below R scrotum, decreased penile pain from 7/10 to < 2/10 after sexual activities    Time  8    Period  Weeks    Status  New    Target Date  07/11/19      PT LONG TERM GOAL #2   Title  Pt will  report no flare up of R hip pain across 1 month and be IND with complimentary stretches to minimize overuse of back mm from repeated turning at work in order to return to walking and ADLs    Time  6    Period  Weeks    Status  New    Target Date  06/27/19      PT LONG TERM GOAL #3   Title  Pt report being able to sit for > 1 hour without relying on large cushion in order to resume community activities and be able to drive without pillow    Time  4    Period  Weeks    Status  New    Target Date  06/13/19      PT LONG TERM GOAL #4   Title  Pt will report no R knee pain nor radiating to buttocks pain across 2 weeks in order to perform ADLs and physical routine    Time  10    Period  Weeks    Status  New    Target Date  08/15/19      PT LONG TERM GOAL #5   Title  Pt will report no more straining with bowel movements 100% of the time, daily bowel movements instead of every other day in order to maintain pelvic health    Time  4    Period  Weeks    Status  New    Target Date  06/13/19      Additional Long Term Goals   Additional Long Term Goals  Yes      PT LONG TERM GOAL #6   Title  Pt will report less urinary urgency from 1-2 x every 2 hours to 1 x every 2 hours in order to work and attend community events    Time  6    Period  Weeks    Status  New    Target Date  06/27/19      PT LONG TERM GOAL #7   Title  Pt will demo less abdominal separation and IND with deep core coordination and strengthening to optimize IAP system for  all pelvic floor functions ( digestion, motility, bowel/ urninary function, sexual function)    Time  8    Period  Weeks    Status  New    Target Date  07/11/19            Plan - 06/18/19 0838    Clinical Impression Statement His pelvic pain is better by 5% and he is doing his deep core exercise and work stretches.  His LBP has been resolved by 80% and he can now sleep on his side again.  Today, pt demo'd less abdominal separation which indicates  improved postural stability and IAP for improving pelvic pain and resolved LBP.   Today, continued to address pt's spinal deviations he presented with on the first day. Lower spine has been addressed with positive outcomes as pt reported LBP has resolved and he is continues to do stretches to counteract repeated R rotations at work.  Reinforced pt to continue last week's exercises that focus on scapular/ cervical retraction and L cervical / throacic rotation to continue counteracting repeated movements at work.   Pt required further manual Tx to address T3 deviated to L and limited L sideflexion which improved post Tx. Pt required cues for coordination of thoracic rotation with cervical retraction to correct forward head posture.   Educated pt to f/u with PCP if vaccine side effects persist. Encouraged pt to f/u with eye doctor re: his vision complaints.    Pt continues to benefit from skilled PT.   Session was abbreviated due to pt arriving late.     Examination-Activity Limitations  Squat;Bend;Sit    Stability/Clinical Decision Making  Evolving/Moderate complexity    Rehab Potential  Good    PT Frequency  1x / week    PT Duration  Other (comment)   10   PT Treatment/Interventions  Moist Heat;Therapeutic activities;Therapeutic exercise;Balance training;Neuromuscular re-education;Manual techniques;Patient/family education;Stair training;Energy conservation;Manual lymph drainage;Taping;Dry needling    Consulted and Agree with Plan of Care  Patient       Patient will benefit from skilled therapeutic intervention in order to improve the following deficits and impairments:  Decreased strength, Decreased balance, Postural dysfunction, Hypomobility, Impaired flexibility, Improper body mechanics, Pain, Decreased endurance, Decreased coordination, Abnormal gait, Difficulty walking, Increased muscle spasms, Decreased mobility, Impaired sensation, Decreased activity tolerance, Increased fascial  restricitons, Decreased safety awareness  Visit Diagnosis: Acute pain of right knee  Chronic right-sided low back pain with right-sided sciatica  Pelvic pain  Other muscle spasm  Other abnormalities of gait and mobility     Problem List Patient Active Problem List   Diagnosis Date Noted  . Hematochezia   . Polyp of sigmoid colon   . Gastroesophageal reflux disease without esophagitis 11/12/2018  . Glucosuria 11/12/2018  . Mild persistent asthma without complication 123XX123    Jerl Mina ,PT, DPT, E-RYT  06/18/2019, 10:04 AM  [place] [facility] SERVICES [address] [place], [place], [address] Phone: [phone]   Fax:  [phone]  Name: Jason Petersen MRN: [id number] Date of Birth: [birth year]

## 2019-06-25 ENCOUNTER — Other Ambulatory Visit: Payer: Self-pay

## 2019-06-25 ENCOUNTER — Ambulatory Visit: Payer: BC Managed Care – PPO | Admitting: Physical Therapy

## 2019-06-25 DIAGNOSIS — M62838 Other muscle spasm: Secondary | ICD-10-CM | POA: Diagnosis not present

## 2019-06-25 DIAGNOSIS — M25561 Pain in right knee: Secondary | ICD-10-CM

## 2019-06-25 DIAGNOSIS — M5441 Lumbago with sciatica, right side: Secondary | ICD-10-CM

## 2019-06-25 DIAGNOSIS — R102 Pelvic and perineal pain: Secondary | ICD-10-CM

## 2019-06-25 DIAGNOSIS — R2689 Other abnormalities of gait and mobility: Secondary | ICD-10-CM

## 2019-06-25 NOTE — Patient Instructions (Signed)
Stretch for pelvic floor  Pelvic Floor stretches   V- slides  "v heels slide away and then back toward buttocks and then rock knee to slight ,  slide heel along at 11 o clock away from buttocks   10 reps       Mermaid stretch  Rocking while seated on the floor with heels to one side of the hip Heels to one side of the hip  Rock forward towards the knee that is bent , rock beck towards the opposite sitting bones  __  Deep core level 1  And 2  Notice pelvic floor can lengthen down / lower down on inhale

## 2019-06-25 NOTE — Therapy (Signed)
[place] MAIN [facility] SERVICES [address] [place], [place], [address] Phone: [phone]   Fax:  [phone]  Physical Therapy Treatment  Patient Details  Name: Jason Petersen MRN: [id number] Date of Birth: Mar 24, 1975 Referring Provider (PT): Vaillancourt   Encounter Date: 06/25/2019  PT End of Session - 06/25/19 0835    Visit Number  5    Number of Visits  10    Date for PT Re-Evaluation  08/01/19    PT Start Time  0815    PT Stop Time  0900    PT Time Calculation (min)  45 min    Activity Tolerance  Patient tolerated treatment well       Past Medical History:  Diagnosis Date  . Asthma     Past Surgical History:  Procedure Laterality Date  . COLONOSCOPY WITH PROPOFOL N/A 05/10/2019   Procedure: COLONOSCOPY WITH PROPOFOL;  Surgeon: Lucilla Lame, MD;  Location: [facility] ENDOSCOPY;  Service: Endoscopy;  Laterality: N/A;  . ESOPHAGOGASTRODUODENOSCOPY (EGD) WITH PROPOFOL N/A 05/10/2019   Procedure: ESOPHAGOGASTRODUODENOSCOPY (EGD) WITH PROPOFOL;  Surgeon: Lucilla Lame, MD;  Location: [facility] ENDOSCOPY;  Service: Endoscopy;  Laterality: N/A;  . NASAL SINUS SURGERY    . SINUS SURGERY WITH INSTATRAK    . tubs in ears      There were no vitals filed for this visit.  Subjective Assessment - 06/25/19 0822    Subjective  Pt reports the fatigue from the vaccine has improved. Pt states the symptoms he has had prior to the vaccine are worst this week:  dizziness, pain down the R side of his neck between his shoulder blades, a shooting pain at R armpit, he feels like something is swollen but he can not find anything that is swollen. Dizziness occurs with turning head and looking in any direction, rolling over. Pt reports it seems like everything is spinning and blurry for a sec on two but he doe snot feel like he will black out.  This dizziness started in Nov and worsened the past 4 weeks.  Denied head trauma recently. Pt reports tinnitis and recently  increased sensitivity to sounds. Pt reports HA from nose bridge to back of the neck.They occur once every day or every other day. Pt sees floaters all the time.Pt has been on a new medication a couple months ago.    Pertinent History  Physical activity: walking 1.5 miles atleast 4 x week, 30 min on elliptical,  dumbbells 20 lbs each hand:  arm / chest standing with feet side by side.  Pt performed sit up and crunches during June and July and felt a "pop like a muscle folding onto another"  in the abdomen by diaphragm. Hernai was r/o by GI. GERD started after this incident. Pt stopped doing sit up/ crunches. 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         [facility] PT Assessment - 06/25/19 0833      Observation/Other Assessments   Observations  less thoracic kyphosis , slumped sitting                     [facility] Adult PT Treatment/Exercise - 06/25/19 0834      Therapeutic Activites    Other Therapeutic Activities  pt was screened by vestibular rehab  specialist                   PT Long Term Goals - 05/23/19 1518      PT LONG TERM GOAL #1   Title  Pt will report no pinching ache at  the point below R scrotum, decreased penile pain from 7/10 to < 2/10 after sexual activities    Time  8    Period  Weeks    Status  New    Target Date  07/11/19      PT LONG TERM GOAL #2   Title  Pt will report no flare up of R hip pain across 1 month and be IND with complimentary stretches to minimize overuse of back mm from repeated turning at work in order to return to walking and ADLs    Time  6    Period  Weeks    Status  New    Target Date  06/27/19      PT LONG TERM GOAL #3   Title  Pt report being able to sit for > 1 hour without relying on large cushion in order to resume community activities and be able to drive without pillow     Time  4    Period  Weeks    Status  New    Target Date  06/13/19      PT LONG TERM GOAL #4   Title  Pt will report no R knee pain nor radiating to buttocks pain across 2 weeks in order to perform ADLs and physical routine    Time  10    Period  Weeks    Status  New    Target Date  08/15/19      PT LONG TERM GOAL #5   Title  Pt will report no more straining with bowel movements 100% of the time, daily bowel movements instead of every other day in order to maintain pelvic health    Time  4    Period  Weeks    Status  New    Target Date  06/13/19      Additional Long Term Goals   Additional Long Term Goals  Yes      PT LONG TERM GOAL #6   Title  Pt will report less urinary urgency from 1-2 x every 2 hours to 1 x every 2 hours in order to work and attend community events    Time  6    Period  Weeks    Status  New    Target Date  06/27/19      PT LONG TERM GOAL #7   Title  Pt will demo less abdominal separation and IND with deep core coordination and strengthening to optimize IAP system for all pelvic floor functions ( digestion, motility, bowel/ urninary function, sexual function)    Time  8    Period  Weeks    Status  New    Target Date  07/11/19            Plan - 06/25/19 0835    Clinical Impression Statement Interdisciplinary update:  Pt reports the fatigue from the vaccine has improved. Pt states the symptoms he has had prior to the vaccine are worst this week:  dizziness, pain down the R side of his neck between his shoulder blades, a shooting pain at R armpit, he feels like something is swollen but he can not find anything that is swollen. Dizziness occurs with  turning head and looking in any direction, rolling over.  Pt had already seen his PCP and was prescribed motion sickness medication but he has not taken it.  Given his Sx, pt was screened by a vestibular rehab specialist PT who ruled out BPPV and advised pt to fu/ ENT provider.   Pt's Pelvic PT session  proceeded and pt demo'd no more deviations in his spine, not more uneven shoulder , iliac height difference. Pt's posture is improving with less thoracic kyphosis and abdominal separation has resolved which indicates improved IAP system. External pelvic floor assessment showed increased tightness at anterior mm which decreased with external manual Tx. Pt demo'd increased pelvic floor mobility post T. Anticipate pt's pelvic Sx will continue to improve with better alignment of pelvic/ spine and increased pelvic floor mobility. Pt continues to benefit from skilled PT    Plan to monitor pt's dizziness, neck shoulder, HA Sx and communicate with pt's MD as needed.       Examination-Activity Limitations  Squat;Bend;Sit    Stability/Clinical Decision Making  Evolving/Moderate complexity    Rehab Potential  Good    PT Frequency  1x / week    PT Duration  Other (comment)   10   PT Treatment/Interventions  Moist Heat;Therapeutic activities;Therapeutic exercise;Balance training;Neuromuscular re-education;Manual techniques;Patient/family education;Stair training;Energy conservation;Manual lymph drainage;Taping;Dry needling;Vestibular    Consulted and Agree with Plan of Care  Patient       Patient will benefit from skilled therapeutic intervention in order to improve the following deficits and impairments:  Decreased strength, Decreased balance, Postural dysfunction, Hypomobility, Impaired flexibility, Improper body mechanics, Pain, Decreased endurance, Decreased coordination, Abnormal gait, Difficulty walking, Increased muscle spasms, Decreased mobility, Impaired sensation, Decreased activity tolerance, Increased fascial restricitons, Decreased safety awareness  Visit Diagnosis: Acute pain of right knee  Chronic right-sided low back pain with right-sided sciatica  Pelvic pain  Other muscle spasm  Other abnormalities of gait and mobility     Problem List Patient Active Problem List   Diagnosis  Date Noted  . Hematochezia   . Polyp of sigmoid colon   . Gastroesophageal reflux disease without esophagitis 11/12/2018  . Glucosuria 11/12/2018  . Mild persistent asthma without complication 123XX123    Jerl Mina ,PT, DPT, E-RYT  06/25/2019, 8:40 AM  [place] MAIN [facility] SERVICES [address] [place], [place], [address] Phone: [phone]   Fax:  [phone]  Name: Jason Petersen MRN: [id number] Date of Birth: 1975-03-26

## 2019-07-02 ENCOUNTER — Other Ambulatory Visit: Payer: Self-pay

## 2019-07-02 ENCOUNTER — Ambulatory Visit: Payer: BC Managed Care – PPO | Attending: Physician Assistant | Admitting: Physical Therapy

## 2019-07-02 DIAGNOSIS — M62838 Other muscle spasm: Secondary | ICD-10-CM | POA: Diagnosis present

## 2019-07-02 DIAGNOSIS — G8929 Other chronic pain: Secondary | ICD-10-CM | POA: Diagnosis present

## 2019-07-02 DIAGNOSIS — R102 Pelvic and perineal pain: Secondary | ICD-10-CM

## 2019-07-02 DIAGNOSIS — R2689 Other abnormalities of gait and mobility: Secondary | ICD-10-CM

## 2019-07-02 DIAGNOSIS — M5441 Lumbago with sciatica, right side: Secondary | ICD-10-CM | POA: Diagnosis present

## 2019-07-02 DIAGNOSIS — M25561 Pain in right knee: Secondary | ICD-10-CM | POA: Diagnosis not present

## 2019-07-02 NOTE — Therapy (Addendum)
[place] MAIN [facility] SERVICES [address] [place], [place], [address] Phone: [phone]   Fax:  [phone]  Physical Therapy Treatment  Patient Details  Name: Jason Petersen MRN: [id number] Date of Birth: 11-10-1975 Referring Provider (PT): Vaillancourt   Encounter Date: 07/02/2019  PT End of Session - 07/02/19 0939    Visit Number  6    Number of Visits  10    Date for PT Re-Evaluation  08/01/19    PT Start Time  0817    PT Stop Time  0900    PT Time Calculation (min)  43 min    Activity Tolerance  Patient tolerated treatment well       Past Medical History:  Diagnosis Date  . Asthma     Past Surgical History:  Procedure Laterality Date  . COLONOSCOPY WITH PROPOFOL N/A 05/10/2019   Procedure: COLONOSCOPY WITH PROPOFOL;  Surgeon: Lucilla Lame, MD;  Location: [facility] ENDOSCOPY;  Service: Endoscopy;  Laterality: N/A;  . ESOPHAGOGASTRODUODENOSCOPY (EGD) WITH PROPOFOL N/A 05/10/2019   Procedure: ESOPHAGOGASTRODUODENOSCOPY (EGD) WITH PROPOFOL;  Surgeon: Lucilla Lame, MD;  Location: [facility] ENDOSCOPY;  Service: Endoscopy;  Laterality: N/A;  . NASAL SINUS SURGERY    . SINUS SURGERY WITH INSTATRAK    . tubs in ears      There were no vitals filed for this visit.  Subjective Assessment - 07/02/19 0822    Subjective  Pt reported no increased dizziness, blurriness except for a few days due to sleep deprivation when he was caring for his wife who was in the hospital for a surgery.  The neck and pain down the shoulder and the pinching/ burning feeling under the armpit is still painful. Pt has an ENT appoint tomorrow. Pt was able get a few stretches in last week for teh pelvic floor. Overall, nothing has gotten worst.    Pertinent History  Physical activity: walking 1.5 miles atleast 4 x week, 30 min on elliptical,  dumbbells 20 lbs each hand:  arm / chest standing with feet side by side.  Pt performed sit up and crunches during June and July and felt a  "pop like a muscle folding onto another"  in the abdomen by diaphragm. Hernai was r/o by GI. GERD started after this incident. Pt stopped doing sit up/ crunches. 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         [facility] PT Assessment - 07/02/19 0828      AROM   Overall AROM Comments  Cervical side flexion 45 deg B, rotation 50 deg B       Palpation   Spinal mobility  T3 slight deviation, tightness at R occiput mm  decreased post Tx)                     [facility] Adult PT Treatment/Exercise - 07/02/19 [id number]      Neuro Re-ed    Neuro Re-ed Details   cervication retraction       Manual Therapy   Manual therapy comments  distraction at T3-4 and medial glide at T3 , STM/MWM at R occiput, STM/MWM at L ischial rami, ischiocavernosus B                     PT Long Term  Goals - 07/02/19 1151      PT LONG TERM GOAL #1   Title  Pt will report no pinching ache at  the point below R scrotum, decreased penile pain from 7/10 to < 2/10 after sexual activities    Time  8    Period  Weeks    Status  On-going      PT LONG TERM GOAL #2   Title  Pt will report no flare up of R hip pain across 1 month and be IND with complimentary stretches to minimize overuse of back mm from repeated turning at work in order to return to walking and ADLs    Time  6    Period  Weeks    Status  On-going      PT LONG TERM GOAL #3   Title  Pt report being able to sit for > 1 hour without relying on large cushion in order to resume community activities and be able to drive without pillow    Time  4    Period  Weeks    Status  On-going      PT LONG TERM GOAL #4   Title  Pt will report no R knee pain nor radiating to buttocks pain across 2 weeks in order to perform ADLs and physical routine    Time  10    Period  Weeks    Status  On-going      PT  LONG TERM GOAL #5   Title  Pt will report no more straining with bowel movements 100% of the time, daily bowel movements instead of every other day in order to maintain pelvic health    Time  4    Period  Weeks    Status  On-going      PT LONG TERM GOAL #6   Title  Pt will report less urinary urgency from 1-2 x every 2 hours to 1 x every 2 hours in order to work and attend community events    Time  6    Period  Weeks    Status  New      PT LONG TERM GOAL #7   Title  Pt will demo less abdominal separation and IND with deep core coordination and strengthening to optimize IAP system for all pelvic floor functions ( digestion, motility, bowel/ urninary function, sexual function)    Time  8    Period  Weeks    Status  Achieved            Plan - 07/02/19 0941    Clinical Impression Statement Interdisciplinary Update:  Pt reports his dizziness and blurry of vision along with the neck and pain down the shoulder and the pinching/ burning feeling under the armpit is still painful but has not worsened but continue to persist for over the past month. Pt has seen his PCP two weeks ag as recommended by PT.  Vestibular rehab specialist at the PT clinic screened pt last week and r/o BPPV and was recommended to f/u with ENT provider.  Pt has an appointment with ENT tomorrow 07/03/19. Pt continues to report limited sleep and has struggled with good sleep quality due to past night shift. Pt would benefit from imaging to head and spine and referral for sleep specialist.      Pelvic PT update:  Pt showed less pelvic floor tightness and less upper thoracic deviation, and increased cervical rotation/ sideflexion compared to last session. Pt required further manual  Tx in these areas to continue correcting the spinal deviations that were present at the first visit. Pt's iliac crest and lumbar spine have remained aligned for the past sessions which has resolved his LBP. Remaining deviations of the spine lie at  upper thoracic. Pt remains compliant with correcting his standing posture to not lean on one leg more than other, performing stretches that compliment the repeated R sided turns required at work, and is currently improving cervical / scapular retraction strength to minimize forward head position.  Today's assessments revealed tightness at R occiput mm. Pt tolerated Tx without complaints and less tenderness at T3 compared to previous sessions. Pt required cues for cervical retraction which will help with correcting pt's forward head position. While pt has showed better alignment of his spine with less scoliotic curves and no more malalignment of his pelvic girdle, pt will continue to benefit from skilled PT and also further medical work up with his ENT to address the non-musculoskeletal related Sx described above.      Examination-Activity Limitations  Squat;Bend;Sit    Stability/Clinical Decision Making  Evolving/Moderate complexity    Rehab Potential  Good    PT Frequency  1x / week    PT Duration  Other (comment)   10   PT Treatment/Interventions  Moist Heat;Therapeutic activities;Therapeutic exercise;Balance training;Neuromuscular re-education;Manual techniques;Patient/family education;Stair training;Energy conservation;Manual lymph drainage;Taping;Dry needling;Vestibular    Consulted and Agree with Plan of Care  Patient       Patient will benefit from skilled therapeutic intervention in order to improve the following deficits and impairments:  Decreased strength, Decreased balance, Postural dysfunction, Hypomobility, Impaired flexibility, Improper body mechanics, Pain, Decreased endurance, Decreased coordination, Abnormal gait, Difficulty walking, Increased muscle spasms, Decreased mobility, Impaired sensation, Decreased activity tolerance, Increased fascial restricitons, Decreased safety awareness  Visit Diagnosis: Acute pain of right knee  Chronic right-sided low back pain with right-sided  sciatica  Pelvic pain  Other muscle spasm  Other abnormalities of gait and mobility     Problem List Patient Active Problem List   Diagnosis Date Noted  . Hematochezia   . Polyp of sigmoid colon   . Gastroesophageal reflux disease without esophagitis 11/12/2018  . Glucosuria 11/12/2018  . Mild persistent asthma without complication 123XX123    Jerl Mina ,PT, DPT, E-RYT  07/02/2019, 12:00 PM  [place] MAIN [facility] SERVICES [address] [place], [place], [address] Phone: [phone]   Fax:  [phone]  Name: Jason Petersen MRN: [id number] Date of Birth: 1975/11/16

## 2019-07-09 ENCOUNTER — Other Ambulatory Visit: Payer: Self-pay

## 2019-07-09 ENCOUNTER — Ambulatory Visit: Payer: BC Managed Care – PPO | Admitting: Physical Therapy

## 2019-07-09 DIAGNOSIS — G8929 Other chronic pain: Secondary | ICD-10-CM

## 2019-07-09 DIAGNOSIS — R2689 Other abnormalities of gait and mobility: Secondary | ICD-10-CM

## 2019-07-09 DIAGNOSIS — M25561 Pain in right knee: Secondary | ICD-10-CM | POA: Diagnosis not present

## 2019-07-09 DIAGNOSIS — R102 Pelvic and perineal pain: Secondary | ICD-10-CM

## 2019-07-09 DIAGNOSIS — M62838 Other muscle spasm: Secondary | ICD-10-CM

## 2019-07-10 NOTE — Therapy (Signed)
[place] MAIN [facility] SERVICES [address] [place], [place], [address] Phone: [phone]   Fax:  [phone]  Physical Therapy Treatment  Patient Details  Name: Demarri Elie MRN: [id number] Date of Birth: 1975/09/08 Referring Provider (PT): Vaillancourt   Encounter Date: 07/09/2019  PT End of Session - 07/09/19 0841    Visit Number  7    Number of Visits  10    Date for PT Re-Evaluation  08/01/19    PT Start Time  0817    PT Stop Time  0900    PT Time Calculation (min)  43 min    Activity Tolerance  Patient tolerated treatment well       Past Medical History:  Diagnosis Date   Asthma     Past Surgical History:  Procedure Laterality Date   COLONOSCOPY WITH PROPOFOL N/A 05/10/2019   Procedure: COLONOSCOPY WITH PROPOFOL;  Surgeon: Lucilla Lame, MD;  Location: [facility] ENDOSCOPY;  Service: Endoscopy;  Laterality: N/A;   ESOPHAGOGASTRODUODENOSCOPY (EGD) WITH PROPOFOL N/A 05/10/2019   Procedure: ESOPHAGOGASTRODUODENOSCOPY (EGD) WITH PROPOFOL;  Surgeon: Lucilla Lame, MD;  Location: [facility] ENDOSCOPY;  Service: Endoscopy;  Laterality: N/A;   NASAL SINUS SURGERY     SINUS SURGERY WITH INSTATRAK     tubs in ears      There were no vitals filed for this visit.  Subjective Assessment - 07/09/19 0824    Subjective  Since last session's Tx, pt reports the stiffness in  the R shoulder and ROM in his neck has improved significantly. The pelvic pain is starting to improve 10-15 % and it has not as constant nor is it as intense. There is definitely an improvement.  Pt has been doing his work stretches 1-2 x day and has been doing the deep core exercises 2 x day.   Pt reported he saw his ENT doctor and was prescribed Prednisone. It worked when it kicked in but as soon as it wore off, the symptoms of dizziness, blurry vision came back . The blurry vision was really bad yesterday. His face was hurting so bad which was when the blurriness was so bad and pt  thinks he has a sinus infection. Pt got some allergy medicine to see if it makes any difference. Pt did noticed after it rained, his symptoms got better which makes him lean towards the cause to be allergy related. The shoulder pain has been better. The neck has been troublesome, it has not been hurting but he hears the grinding, popping, and clicking "like a light switch" when he rotates his head left and right and he feels the feeling related to the clicking is radiating up to his head right up the middle. Pt notices the clicking only occurs when the sinus pressure and blurriness is going on. Pt points to C1 and reported this is where all the popping, grinding happens and where the HA originate from. There is ringing in the ears ( R >L)  constantly all day and night and part of the reason he does not sleep well.  Pt reports he has had ringing in his ears since childhood.    Pertinent History  Physical activity: walking 1.5 miles atleast 4 x week, 30 min on elliptical,  dumbbells 20 lbs each hand:  arm / chest standing with feet side by side.  Pt performed sit up and crunches during June and July and felt a "pop like a muscle folding onto another"  in the abdomen by diaphragm.  Hernai was r/o by GI. GERD started after this incident. Pt stopped doing sit up/ crunches. 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         [facility] PT Assessment - 07/09/19 0841      AROM   Overall AROM Comments  sideflexion R 60 deg, L 50 deg ( preTx)  L 55 deg ( post Tx) , rotation B 60 deg       Palpation   Spinal mobility  tightness at R occiput which is where pt reports the clicking sound     Palpation comment  no T3 deviated L                     [facility] Adult PT Treatment/Exercise - 07/09/19 0905      Manual Therapy   Manual therapy comments  distraction at  occiput, STM/MWM to promote cervical sideflxion L ,                   PT Long Term Goals - 07/02/19 1151      PT LONG TERM GOAL #1   Title  Pt will report no pinching ache at  the point below R scrotum, decreased penile pain from 7/10 to < 2/10 after sexual activities    Time  8    Period  Weeks    Status  On-going      PT LONG TERM GOAL #2   Title  Pt will report no flare up of R hip pain across 1 month and be IND with complimentary stretches to minimize overuse of back mm from repeated turning at work in order to return to walking and ADLs    Time  6    Period  Weeks    Status  On-going      PT LONG TERM GOAL #3   Title  Pt report being able to sit for > 1 hour without relying on large cushion in order to resume community activities and be able to drive without pillow    Time  4    Period  Weeks    Status  On-going      PT LONG TERM GOAL #4   Title  Pt will report no R knee pain nor radiating to buttocks pain across 2 weeks in order to perform ADLs and physical routine    Time  10    Period  Weeks    Status  On-going      PT LONG TERM GOAL #5   Title  Pt will report no more straining with bowel movements 100% of the time, daily bowel movements instead of every other day in order to maintain pelvic health    Time  4    Period  Weeks    Status  On-going      PT LONG TERM GOAL #6   Title  Pt will report less urinary urgency from 1-2 x every 2 hours to 1 x every 2 hours in order to work and attend community events    Time  6    Period  Weeks    Status  New      PT LONG TERM GOAL #7   Title  Pt will demo less abdominal separation and IND with deep core coordination and  strengthening to optimize IAP system for all pelvic floor functions ( digestion, motility, bowel/ urninary function, sexual function)    Time  8    Period  Weeks    Status  Achieved            Plan - 07/10/19 0908    Clinical Impression Statement Manual Tx at his spine and pelvic floor mm  have helped with decreasing the stiffness in his R shoulder and ROM in his neck and improved his pelvic pain by 10-15 % with pain not as constant nor is it as intense.  Pt remains compliant with his work stretches 1-2 x day and has been doing the deep core exercises 2 x day. Today's assessment showed significantly decreased pelvic floor mm tightness but pt still required external manual Tx to increase mobility. Cervical spine showed less hypomobility but required Tx to address R occiput mm tightness. Pt showed increased sideflexion L post Tx.    Interdisciplinary Update: Pt has consulted his ENT and was prescribed Prednisone. It worked when it kicked in but as soon as it wore off, the symptoms of dizziness, blurry vision came back . The blurry vision was really bad yesterday. His face was hurting so bad which was when the blurriness was so bad and pt thinks he has a sinus infection. Pt got some allergy medicine to see if it makes any difference. Pt did noticed after it rained, his symptoms got better which makes him lean towards the cause to be allergy related. The shoulder pain has been better. The neck has been troublesome, it has not been hurting but he hears the grinding, popping, and clicking "like a light switch" when he rotates his head left and right and he feels the feeling related to the clicking is radiating up to his head right up the middle. Pt notices the clicking only occurs when the sinus pressure and blurriness is going on.  Plan to continue addressing musculoskeletal deviations as past Tx to cervical/ shoulder area has made improvements to his shoulder area.  His description of "clicking, grinding, popping that occurs with the sinus pressure and blurriness may be musculoskeletal related but also will refer pt back to his MD and pursue further imaging if his dizziness/ blurry vision continues to persist. DPT had already referred pt back to his PCP a few weeks ago when pt reported these Sx.  Pt  has not demo'd red flag signs.   Pt continues to benefit from skilled PT       Examination-Activity Limitations  Squat;Bend;Sit    Stability/Clinical Decision Making  Evolving/Moderate complexity    Rehab Potential  Good    PT Frequency  1x / week    PT Duration  Other (comment)   10   PT Treatment/Interventions  Moist Heat;Therapeutic activities;Therapeutic exercise;Balance training;Neuromuscular re-education;Manual techniques;Patient/family education;Stair training;Energy conservation;Manual lymph drainage;Taping;Dry needling;Vestibular    Consulted and Agree with Plan of Care  Patient       Patient will benefit from skilled therapeutic intervention in order to improve the following deficits and impairments:  Decreased strength, Decreased balance, Postural dysfunction, Hypomobility, Impaired flexibility, Improper body mechanics, Pain, Decreased endurance, Decreased coordination, Abnormal gait, Difficulty walking, Increased muscle spasms, Decreased mobility, Impaired sensation, Decreased activity tolerance, Increased fascial restricitons, Decreased safety awareness  Visit Diagnosis: Acute pain of right knee  Chronic right-sided low back pain with right-sided sciatica  Pelvic pain  Other muscle spasm  Other abnormalities of gait and mobility     Problem List  Patient Active Problem List   Diagnosis Date Noted   Hematochezia    Polyp of sigmoid colon    Gastroesophageal reflux disease without esophagitis 11/12/2018   Glucosuria 11/12/2018   Mild persistent asthma without complication 13/88/7195    Jerl Mina ,PT, DPT, E-RYT  07/10/2019, 9:09 AM  [place] MAIN [facility] SERVICES [address] [place], [place], [address] Phone: [phone]   Fax:  [phone]  Name: Aadith Raudenbush MRN: [id number] Date of Birth: [birth year]

## 2019-07-16 ENCOUNTER — Other Ambulatory Visit: Payer: Self-pay

## 2019-07-16 ENCOUNTER — Ambulatory Visit: Payer: BC Managed Care – PPO | Admitting: Physical Therapy

## 2019-07-16 DIAGNOSIS — R2689 Other abnormalities of gait and mobility: Secondary | ICD-10-CM

## 2019-07-16 DIAGNOSIS — M25561 Pain in right knee: Secondary | ICD-10-CM

## 2019-07-16 DIAGNOSIS — R102 Pelvic and perineal pain: Secondary | ICD-10-CM

## 2019-07-16 DIAGNOSIS — M62838 Other muscle spasm: Secondary | ICD-10-CM

## 2019-07-16 DIAGNOSIS — G8929 Other chronic pain: Secondary | ICD-10-CM

## 2019-07-16 NOTE — Patient Instructions (Signed)
Lying on back, knees bent    band under ballmounds  while laying on back w/ knees bent  "W" exercise  10 reps x 3 sets   Band is placed under feet, knees bent, feet are hip width apart Hold band with thumbs point out, keep upper arm and elbow touching the bed the whole time  - inhale and then exhale pull bands by bending elbows hands move in a "w"  (feel shoulder blades squeezing)   __  Abdominal massage upward from L,  R , center to belly button 3 stroke x 3 , pressure is gentle and light with all fingers flat, not using finger tips

## 2019-07-16 NOTE — Therapy (Signed)
[place] MAIN [facility] SERVICES [address] [place], [place], [address] Phone: [phone]   Fax:  [phone]  Physical Therapy Treatment  Patient Details  Name: Jason Petersen MRN: [id number] Date of Birth: 1975/10/18 Referring Provider (PT): Vaillancourt   Encounter Date: 07/16/2019   PT End of Session - 07/16/19 0912    Visit Number 8    Number of Visits 10    Date for PT Re-Evaluation 08/01/19    PT Start Time 0817    PT Stop Time 0900    PT Time Calculation (min) 43 min    Activity Tolerance Patient tolerated treatment well           Past Medical History:  Diagnosis Date  . Asthma     Past Surgical History:  Procedure Laterality Date  . COLONOSCOPY WITH PROPOFOL N/A 05/10/2019   Procedure: COLONOSCOPY WITH PROPOFOL;  Surgeon: Lucilla Lame, MD;  Location: [facility] ENDOSCOPY;  Service: Endoscopy;  Laterality: N/A;  . ESOPHAGOGASTRODUODENOSCOPY (EGD) WITH PROPOFOL N/A 05/10/2019   Procedure: ESOPHAGOGASTRODUODENOSCOPY (EGD) WITH PROPOFOL;  Surgeon: Lucilla Lame, MD;  Location: [facility] ENDOSCOPY;  Service: Endoscopy;  Laterality: N/A;  . NASAL SINUS SURGERY    . SINUS SURGERY WITH INSTATRAK    . tubs in ears      There were no vitals filed for this visit.   Subjective Assessment - 07/16/19 0824    Subjective Pt reported his sinus infection Sx worsened last week with more pain located behind his eyes. Pt could not lean forward because it felt like his head was going to explore and eye were going to bug out. His son also has an ear infection.  The neck pain with moving his head and his vertigo is still there. His allergy medication did help   The R shoulder pinching/ burning feeling has not been bad for the past week.    The pelvic pain and knee pain came back for a few days after pt finished his Prednisone and not so pain now.       Pertinent History Physical activity: walking 1.5 miles atleast 4 x week, 30 min on elliptical,   dumbbells 20 lbs each hand:  arm / chest standing with feet side by side.  Pt performed sit up and crunches during June and July and felt a "pop like a muscle folding onto another"  in the abdomen by diaphragm. Hernai was r/o by GI. GERD started after this incident. Pt stopped doing sit up/ crunches. 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              [facility] PT Assessment - 07/16/19 0828      AROM   Overall AROM Comments L sideflexion 55 deg,        Palpation   Palpation comment no deviations at cervical/ thoracic spine.                        Pelvic Floor Special Questions - 07/16/19 1324    External Perineal Exam through clothing bulbospongiosus/ ischicavernosus at B ( L >R)  tightness and area of pain, adductor tightness L >R  ( decreased post Tx)              [facility] Adult PT Treatment/Exercise -  07/16/19 0900      Neuro Re-ed    Neuro Re-ed Details  cued for new HEP        Manual Therapy   Manual therapy comments adductor, bulbospongiousus, ischiocavernosus B   (STM/MWM)                         PT Long Term Goals - 07/02/19 1151      PT LONG TERM GOAL #1   Title Pt will report no pinching ache at  the point below R scrotum, decreased penile pain from 7/10 to < 2/10 after sexual activities    Time 8    Period Weeks    Status On-going      PT LONG TERM GOAL #2   Title Pt will report no flare up of R hip pain across 1 month and be IND with complimentary stretches to minimize overuse of back mm from repeated turning at work in order to return to walking and ADLs    Time 6    Period Weeks    Status On-going      PT LONG TERM GOAL #3   Title Pt report being able to sit for > 1 hour without relying on large cushion in order to resume community activities and be able to drive without pillow    Time  4    Period Weeks    Status On-going      PT LONG TERM GOAL #4   Title Pt will report no R knee pain nor radiating to buttocks pain across 2 weeks in order to perform ADLs and physical routine    Time 10    Period Weeks    Status On-going      PT LONG TERM GOAL #5   Title Pt will report no more straining with bowel movements 100% of the time, daily bowel movements instead of every other day in order to maintain pelvic health    Time 4    Period Weeks    Status On-going      PT LONG TERM GOAL #6   Title Pt will report less urinary urgency from 1-2 x every 2 hours to 1 x every 2 hours in order to work and attend community events    Time 6    Period Weeks    Status New      PT LONG TERM GOAL #7   Title Pt will demo less abdominal separation and IND with deep core coordination and strengthening to optimize IAP system for all pelvic floor functions ( digestion, motility, bowel/ urninary function, sexual function)    Time 8    Period Weeks    Status Achieved                 Plan - 07/16/19 0915    Clinical Impression Statement  Pelvic Improvements: Pt's pelvic floor mm tightness is decreasing as pt also reports pelvic pain has decreased. Spinal and pelvic girdle alignment have improved significantly and neck AROM have been restored bilaterally.   Pt demo'd no more deviations of cervical/ thoracic segments today and has maintained increased cervical sideflexion across the past 2 sessions. These improvements indicate musculoskeletal improvements.    Progressed pt to scapular / cervical strengthening with bands in hooklying position which will help with minimize sinus drainage issues which pt appears to be having.    Interdisciplinary Updates:  Pt will be seeing his ENT this Friday re:  Pt reported  his sinus infection Sx worsened last week with more pain located behind his eyes. Pt could not lean forward because it felt like his head was going to explore and eye were going to  bug out. His son also has an ear infection.  The R shoulder pinching/ burning feeling has not been bad for the past week.    Pt arrived late and thus session was abbreviated  Pt continues to benefit from skilled PT.    Examination-Activity Limitations Squat;Bend;Sit    Stability/Clinical Decision Making Evolving/Moderate complexity    Rehab Potential Good    PT Frequency 1x / week    PT Duration Other (comment)   10   PT Treatment/Interventions Moist Heat;Therapeutic activities;Therapeutic exercise;Balance training;Neuromuscular re-education;Manual techniques;Patient/family education;Stair training;Energy conservation;Manual lymph drainage;Taping;Dry needling;Vestibular    Consulted and Agree with Plan of Care Patient           Patient will benefit from skilled therapeutic intervention in order to improve the following deficits and impairments:  Decreased strength, Decreased balance, Postural dysfunction, Hypomobility, Impaired flexibility, Improper body mechanics, Pain, Decreased endurance, Decreased coordination, Abnormal gait, Difficulty walking, Increased muscle spasms, Decreased mobility, Impaired sensation, Decreased activity tolerance, Increased fascial restricitons, Decreased safety awareness  Visit Diagnosis: Acute pain of right knee  Chronic right-sided low back pain with right-sided sciatica  Pelvic pain  Other muscle spasm  Other abnormalities of gait and mobility     Problem List Patient Active Problem List   Diagnosis Date Noted  . Hematochezia   . Polyp of sigmoid colon   . Gastroesophageal reflux disease without esophagitis 11/12/2018  . Glucosuria 11/12/2018  . Mild persistent asthma without complication 31/59/4585    Jerl Mina ,PT, DPT, E-RYT  07/16/2019, 9:16 AM  [place] MAIN [facility] SERVICES [address] [place], [place], [address] Phone: [phone]   Fax:  [phone]  Name: Jason Petersen MRN:  [id number] Date of Birth: 1975/03/30

## 2019-07-23 ENCOUNTER — Ambulatory Visit: Payer: BC Managed Care – PPO | Admitting: Physical Therapy

## 2019-07-23 ENCOUNTER — Other Ambulatory Visit: Payer: Self-pay

## 2019-07-23 DIAGNOSIS — R102 Pelvic and perineal pain: Secondary | ICD-10-CM

## 2019-07-23 DIAGNOSIS — R2689 Other abnormalities of gait and mobility: Secondary | ICD-10-CM

## 2019-07-23 DIAGNOSIS — M25561 Pain in right knee: Secondary | ICD-10-CM | POA: Diagnosis not present

## 2019-07-23 DIAGNOSIS — M62838 Other muscle spasm: Secondary | ICD-10-CM

## 2019-07-23 DIAGNOSIS — M5441 Lumbago with sciatica, right side: Secondary | ICD-10-CM

## 2019-07-23 NOTE — Patient Instructions (Signed)
[place]ust pose    Palms face midline by hips  Finger tips shooting straight down  Imagine holding pencil under your armpits Draw shoulders away from ears Inhale Exhale lift chest up slightly without feeling it in your back. The bend happens in the midback  (keep chin tucked)  5 sec holds, 10 reps  ___  Standing version facing away from desk  minisquat position Chin tuck  Pinky fingers press against table  5 sec holds, 10 reps

## 2019-07-23 NOTE — Therapy (Signed)
[place] MAIN [facility] SERVICES [address] [place], [place], [address] Phone: [phone]   Fax:  [phone]  Physical Therapy Treatment  Patient Details  Name: Jason Petersen MRN: [id number] Date of Birth: Jun 06, 1975 Referring Provider (PT): Vaillancourt   Encounter Date: 07/23/2019   PT End of Session - 07/23/19 0827    Visit Number 9    Number of Visits 10    Date for PT Re-Evaluation 08/01/19    PT Start Time 0817    PT Stop Time 0903    PT Time Calculation (min) 46 min    Activity Tolerance Patient tolerated treatment well           Past Medical History:  Diagnosis Date  . Asthma     Past Surgical History:  Procedure Laterality Date  . COLONOSCOPY WITH PROPOFOL N/A 05/10/2019   Procedure: COLONOSCOPY WITH PROPOFOL;  Surgeon: Lucilla Lame, MD;  Location: [facility] ENDOSCOPY;  Service: Endoscopy;  Laterality: N/A;  . ESOPHAGOGASTRODUODENOSCOPY (EGD) WITH PROPOFOL N/A 05/10/2019   Procedure: ESOPHAGOGASTRODUODENOSCOPY (EGD) WITH PROPOFOL;  Surgeon: Lucilla Lame, MD;  Location: [facility] ENDOSCOPY;  Service: Endoscopy;  Laterality: N/A;  . NASAL SINUS SURGERY    . SINUS SURGERY WITH INSTATRAK    . tubs in ears      There were no vitals filed for this visit.   Subjective Assessment - 07/23/19 9518    Subjective Pt reported he saw his ENT doctor who prescribed him strong antibiotics and he feels his vision is better, his ears are popping, and most of the head pressure is gone. The neck pain is present and there is cracking with head turns. Pt has a HA this morning .  His spine has been good. Pelvic pain is at a 3-4/ 10 which is lower than compared to 7/10 at the start of PT.    Pertinent History Physical activity: walking 1.5 miles atleast 4 x week, 30 min on elliptical,  dumbbells 20 lbs each hand:  arm / chest standing with feet side by side.  Pt performed sit up and crunches during June and July and felt a "pop like a muscle folding onto  another"  in the abdomen by diaphragm. Hernai was r/o by GI. GERD started after this incident. Pt stopped doing sit up/ crunches. 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              [facility] PT Assessment - 07/23/19 0906      Palpation   Palpation comment no deviations at cervical spine, hypomobility at T2-3,  tightness at R occiput mm                       Pelvic Floor Special Questions - 07/23/19 0907    External Perineal Exam through clothing R bulbospongiosus/ deep transverse perineal              [facility] Adult PT Treatment/Exercise - 07/23/19 8416      Neuro Re-ed    Neuro Re-ed Details  cued for scapular/ cervical retraction in new HEP       Manual Therapy   Manual therapy comments distraction at occiput, STM/MWM to release R occiput and mobilize Grade PA mob III with cue for scapular retraction at T2-3 , STM/MWM  R bulbospongiosus/ deep perineal                        PT Long Term Goals - 07/02/19 1151      PT LONG TERM GOAL #1   Title Pt will report no pinching ache at  the point below R scrotum, decreased penile pain from 7/10 to < 2/10 after sexual activities    Time 8    Period Weeks    Status On-going      PT LONG TERM GOAL #2   Title Pt will report no flare up of R hip pain across 1 month and be IND with complimentary stretches to minimize overuse of back mm from repeated turning at work in order to return to walking and ADLs    Time 6    Period Weeks    Status On-going      PT LONG TERM GOAL #3   Title Pt report being able to sit for > 1 hour without relying on large cushion in order to resume community activities and be able to drive without pillow    Time 4    Period Weeks    Status On-going      PT LONG TERM GOAL #4   Title Pt will report no R knee pain nor radiating to buttocks  pain across 2 weeks in order to perform ADLs and physical routine    Time 10    Period Weeks    Status On-going      PT LONG TERM GOAL #5   Title Pt will report no more straining with bowel movements 100% of the time, daily bowel movements instead of every other day in order to maintain pelvic health    Time 4    Period Weeks    Status On-going      PT LONG TERM GOAL #6   Title Pt will report less urinary urgency from 1-2 x every 2 hours to 1 x every 2 hours in order to work and attend community events    Time 6    Period Weeks    Status New      PT LONG TERM GOAL #7   Title Pt will demo less abdominal separation and IND with deep core coordination and strengthening to optimize IAP system for all pelvic floor functions ( digestion, motility, bowel/ urninary function, sexual function)    Time 8    Period Weeks    Status Achieved                 Plan - 07/23/19 0908    Clinical Impression Statement Pt showed no more deviations to his cervical/ thoracic spine. Pt required manual Tx to further release R occiput and pelvic floor mm ( bulbospongiosus/ deep perineal tranvserse mm) to day which decreased in tightness after Tx. Pt reported his HA went away at the end the session. Advised pt to continue perform stretches that counteract repeated R sided turns required at work and anticipate this will help minimize these R-sided mm tightness. . Added cervical/ scapular retraction strengthening to build cervical extensor strength. Forward head posture has been minimized over the past session.   Interdisciplinary update: Pt reported his antibiotics prescribed by ENT MD is clearing his sinus Sx and his blurry vision resolved.   Pt continues to benefit from skilled PT    Examination-Activity Limitations Squat;Bend;Sit    Stability/Clinical Decision Making Evolving/Moderate complexity    Rehab Potential Good  PT Frequency 1x / week    PT Duration Other (comment)   10   PT  Treatment/Interventions Moist Heat;Therapeutic activities;Therapeutic exercise;Balance training;Neuromuscular re-education;Manual techniques;Patient/family education;Stair training;Energy conservation;Manual lymph drainage;Taping;Dry needling;Vestibular    Consulted and Agree with Plan of Care Patient           Patient will benefit from skilled therapeutic intervention in order to improve the following deficits and impairments:  Decreased strength, Decreased balance, Postural dysfunction, Hypomobility, Impaired flexibility, Improper body mechanics, Pain, Decreased endurance, Decreased coordination, Abnormal gait, Difficulty walking, Increased muscle spasms, Decreased mobility, Impaired sensation, Decreased activity tolerance, Increased fascial restricitons, Decreased safety awareness  Visit Diagnosis: Acute pain of right knee  Chronic right-sided low back pain with right-sided sciatica  Pelvic pain  Other muscle spasm  Other abnormalities of gait and mobility     Problem List Patient Active Problem List   Diagnosis Date Noted  . Hematochezia   . Polyp of sigmoid colon   . Gastroesophageal reflux disease without esophagitis 11/12/2018  . Glucosuria 11/12/2018  . Mild persistent asthma without complication 89/16/9450    Jerl Mina ,PT, DPT, E-RYT  07/23/2019, 9:09 AM  [place] MAIN [facility] SERVICES [address] [place], [place], [address] Phone: [phone]   Fax:  [phone]  Name: Jason Petersen MRN: [id number] Date of Birth: 1975/05/12

## 2019-07-30 ENCOUNTER — Other Ambulatory Visit: Payer: Self-pay

## 2019-07-30 ENCOUNTER — Ambulatory Visit: Payer: BC Managed Care – PPO | Admitting: Physical Therapy

## 2019-07-30 DIAGNOSIS — R2689 Other abnormalities of gait and mobility: Secondary | ICD-10-CM

## 2019-07-30 DIAGNOSIS — M5441 Lumbago with sciatica, right side: Secondary | ICD-10-CM

## 2019-07-30 DIAGNOSIS — M25561 Pain in right knee: Secondary | ICD-10-CM

## 2019-07-30 DIAGNOSIS — R102 Pelvic and perineal pain: Secondary | ICD-10-CM

## 2019-07-30 DIAGNOSIS — M62838 Other muscle spasm: Secondary | ICD-10-CM

## 2019-07-30 NOTE — Therapy (Signed)
[place] MAIN [facility] SERVICES [address] [place], [place], [address] Phone: [phone]   Fax:  [phone]  Physical Therapy Treatment / Progress Note  Patient Details  Name: Jason Petersen MRN: [id number] Date of Birth: 1976/01/16 Referring Provider (PT): Vaillancourt   Encounter Date: 07/30/2019   PT End of Session - 07/30/19 0825    Visit Number 10    Number of Visits 20    Date for PT Re-Evaluation 10/08/19    PT Start Time 0815    PT Stop Time 0900    PT Time Calculation (min) 45 min    Activity Tolerance Patient tolerated treatment well           Past Medical History:  Diagnosis Date  . Asthma     Past Surgical History:  Procedure Laterality Date  . COLONOSCOPY WITH PROPOFOL N/A 05/10/2019   Procedure: COLONOSCOPY WITH PROPOFOL;  Surgeon: Lucilla Lame, MD;  Location: [facility] ENDOSCOPY;  Service: Endoscopy;  Laterality: N/A;  . ESOPHAGOGASTRODUODENOSCOPY (EGD) WITH PROPOFOL N/A 05/10/2019   Procedure: ESOPHAGOGASTRODUODENOSCOPY (EGD) WITH PROPOFOL;  Surgeon: Lucilla Lame, MD;  Location: [facility] ENDOSCOPY;  Service: Endoscopy;  Laterality: N/A;  . NASAL SINUS SURGERY    . SINUS SURGERY WITH INSTATRAK    . tubs in ears      There were no vitals filed for this visit.   Subjective Assessment - 07/30/19 0816    Subjective Pain was a nagging pain prior to PT, now it occurs randomly. Pt reported there is still cracking in his neck. Pt is waking up early in the morning to pee real bad and that has been occuring the past week. Penile pain has not increased and is staying at a 4/10 and reports there is still swelling at the shaft. Pt has been able to return to his PT exercises. Pt would like to work on his R knee pain which occurs with stairs and inclines and with sitting at his work. Pt It has stopped him from using the Elliptical.    Pt reported his blurry vision is not happening all the time anymore but pressure behind the R eye and  ringing in the ears and sinus issues are still a problem. Pt will be seeing his ENT next week and will f/u with imaging.     Pertinent History Physical activity: walking 1.5 miles atleast 4 x week, 30 min on elliptical,  dumbbells 20 lbs each hand:  arm / chest standing with feet side by side.  Pt performed sit up and crunches during June and July and felt a "pop like a muscle folding onto another"  in the abdomen by diaphragm. Hernai was r/o by GI. GERD started after this incident. Pt stopped doing sit up/ crunches. 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              [facility] PT Assessment - 07/30/19 0921      Lunges   Comments backward lunges with poor propioception at knee      Step Down   Comments poor eccentric control       Palpation   Palpation comment no deviations at cervical spine, increased mobility T2-3,  [place] Adult PT Treatment/Exercise - 07/30/19 0948      Therapeutic Activites    Other Therapeutic Activities reassessed goals, discussed POC,       Neuro Re-ed    Neuro Re-ed Details  excessive cues for knee propioception in backward lunges and ecentric control in step down   cued for new exercsies      Exercises   Exercises --   see pt instructions                      PT Long Term Goals - 07/30/19 0950      PT LONG TERM GOAL #1   Title Pt will report no pinching ache at  the point below R scrotum, decreased penile pain from 7/10 to < 2/10 after sexual activities    Baseline 6/29:  4/10 at this spot but pt has not had sexual intercourse.  Pain was a nagging pain prior to PT, now it occurs randomly    Time 8    Period Weeks    Status On-going      PT LONG TERM GOAL #2   Title Pt will report no flare up of R hip pain across 1 month and be IND with complimentary  stretches to minimize overuse of back mm from repeated turning at work in order to return to walking and ADLs    Time 6    Period Weeks    Status Achieved      PT LONG TERM GOAL #3   Title Pt report being able to sit for > 1 hour without relying on large cushion in order to resume community activities and be able to drive without pillow    Time 4    Period Weeks    Status Achieved      PT LONG TERM GOAL #4   Title Pt will report no R knee pain nor radiating to buttocks pain across 2 weeks in order to perform ADLs and physical routine    Baseline 6/29:  buttock pain has resolved but R knee pain is occuring most days    Time 10    Period Weeks    Status Partially Met      PT LONG TERM GOAL #5   Title Pt will report no more straining with bowel movements 100% of the time, daily bowel movements instead of every other day in order to maintain pelvic health    Baseline 6/29:  daily BMs, 98% of the time with no straining    Time 4    Period Weeks    Status Achieved      Additional Long Term Goals   Additional Long Term Goals Yes      PT LONG TERM GOAL #6   Title Pt will report less urinary urgency from 1-2 x every 2 hours to 1 x every 2 hours in order to work and attend community events    Baseline 6/29:  1 x every 1 hour on some days    Time 6    Period Weeks    Status Partially Met      PT LONG TERM GOAL #7   Title Pt will demo less abdominal separation and IND with deep core coordination and strengthening to optimize IAP system for all pelvic floor functions ( digestion, motility, bowel/ urninary function, sexual function)    Time 8    Period Weeks    Status Achieved      PT LONG TERM GOAL #  8   Title Pt will demo improved eccentric control with step down test in order to minimzie knee pain when navigating stairs and hills    Time 6    Period Weeks    Status New      PT LONG TERM GOAL  #9   TITLE Pt will return to Elliptical walking without knee pain to maintain aerobic  health    Time 10    Status New    Target Date 10/08/19                 Plan - 07/30/19 0826    Clinical Impression Statement Pt has achieved 4/9 goals and progressing well towards remaining goals. Pt 's pelvic pain is decreasing in intensity and frequency. Pt has had no flare up with R hip pain. Pt's bowel movements are occurring daily and without straining.    Pt's spinal deviations and pelvic girdle misalignment have resolved with manual Tx, stretches to counteract repetitve motions at work, and strengthening exercises. Pt's pelvic floor mm have decreased in tightness and pt is able to demo improved deep coordination. Pt 's diastasis recti has improved. These improvements signify improved IAP system for better postural stability an pelvic function.   Currently working on R knee pain. Pt showed poor eccentric control with step down and poor propioception of knee in lunges. Pt demo'd improved techniques after cues with new HEP today.  Pt continues to benefit from skilled PT.   Interdisciplinary Update: Pt will be f/u with ENT re: blurry vision, pressure/ sinus Sx, ringing in his ears which are all Sx he has had for across 2 months. Antibiotics and sinus medications have helped some but Sx still persist. DPT advocated for pt to undergo further imaging.  Pt agreed.      Examination-Activity Limitations Squat;Bend;Sit    Stability/Clinical Decision Making Evolving/Moderate complexity    Rehab Potential Good    PT Frequency 1x / week    PT Duration Other (comment)   10   PT Treatment/Interventions Moist Heat;Therapeutic activities;Therapeutic exercise;Balance training;Neuromuscular re-education;Manual techniques;Patient/family education;Stair training;Energy conservation;Manual lymph drainage;Taping;Dry needling;Vestibular    Consulted and Agree with Plan of Care Patient           Patient will benefit from skilled therapeutic intervention in order to improve the following deficits  and impairments:  Decreased strength, Decreased balance, Postural dysfunction, Hypomobility, Impaired flexibility, Improper body mechanics, Pain, Decreased endurance, Decreased coordination, Abnormal gait, Difficulty walking, Increased muscle spasms, Decreased mobility, Impaired sensation, Decreased activity tolerance, Increased fascial restricitons, Decreased safety awareness  Visit Diagnosis: Acute pain of right knee  Chronic right-sided low back pain with right-sided sciatica  Pelvic pain  Other muscle spasm  Other abnormalities of gait and mobility     Problem List Patient Active Problem List   Diagnosis Date Noted  . Hematochezia   . Polyp of sigmoid colon   . Gastroesophageal reflux disease without esophagitis 11/12/2018  . Glucosuria 11/12/2018  . Mild persistent asthma without complication 11/12/2018    Yeung,Shin Yiing ,PT, DPT, E-RYT  07/30/2019, 9:52 AM  [place] [facility] REHAB SERVICES [address] [place], [place], [address] Phone: [phone]   Fax:  [phone]  Name: Harlow Scharfenberg MRN: [id number] Date of Birth: 08/02/1975   

## 2019-07-30 NOTE — Patient Instructions (Signed)
   Walking with band, thumbs up and hands held behind back by back pockets , body face away from door  3 steps forward and back   3 mins    Also in the pool         __________________________  Oblique/ scapula stabilization   Opposite arm   Place band in "U"    band under ballmounds  while laying on back w/ knees bent     20 reps  on each side  Holding band from opposite thigh,  Inhale,    exhale then pull band across body while keeping elbow , shoulders, back of the head pressed down    ______________ Exercise check off   Mon Tue Wed Thurs Fri  Sat Sun  Pelvic Floor stretches    V- slides    Mermaid stretch              Core:  Deep core level 1  and 2            Neck: Locust pose            Neck and knee Band Sneak walking  3 min           Neck  Band -cross over pull                             Work stretches to minimize thoracic spine tightness:     Back and back against the wall :   Angel wings 10 reps  By wall    Gentle chin up and chin tuck 10 reps      Elbow at 90 deg and shoulder press With wall squats in hale down  exhale stand ( engage in deep core )   20 reps    Work neck strengthening Standing version facing away from desk  minisquat position Chin tuck  Pinky fingers press against table  5 sec holds, 10 reps

## 2019-08-21 ENCOUNTER — Ambulatory Visit (INDEPENDENT_AMBULATORY_CARE_PROVIDER_SITE_OTHER): Payer: BC Managed Care – PPO | Admitting: Dermatology

## 2019-08-21 ENCOUNTER — Other Ambulatory Visit: Payer: Self-pay

## 2019-08-21 DIAGNOSIS — D485 Neoplasm of uncertain behavior of skin: Secondary | ICD-10-CM

## 2019-08-21 DIAGNOSIS — D492 Neoplasm of unspecified behavior of bone, soft tissue, and skin: Secondary | ICD-10-CM

## 2019-08-21 NOTE — Patient Instructions (Signed)

## 2019-08-21 NOTE — Progress Notes (Signed)
   New Patient Visit  Subjective  Jason Petersen is a 44 y.o. male who presents for the following: Skin Problem (Lesion at left lower lip. Hits when shaving, bleeds. Would like removed.).  Objective  Well appearing patient in no apparent distress; mood and affect are within normal limits.  Review of Systems: No other skin or systemic complaints except as noted in HPI or Assessment and Plan.  A focused examination was performed including head, including the scalp, face, neck, nose, ears, eyelids, and lips. Relevant physical exam findings are noted in the Assessment and Plan.  Objective  Left Lower Cutaneous Lip: 0.8cm red-purple papule  Assessment & Plan    Neoplasm of skin Left Lower Cutaneous Lip  Epidermal / dermal shaving  Lesion diameter (cm):  0.8 Informed consent: discussed and consent obtained   Timeout: patient name, date of birth, surgical site, and procedure verified   Procedure prep:  Patient was prepped and draped in usual sterile fashion Prep type:  Isopropyl alcohol Anesthesia: the lesion was anesthetized in a standard fashion   Anesthetic:  1% lidocaine w/ epinephrine 1-100,000 buffered w/ 8.4% NaHCO3 Instrument used: #15 blade   Hemostasis achieved with: pressure, aluminum chloride and electrodesiccation   Outcome: patient tolerated procedure well   Post-procedure details: sterile dressing applied and wound care instructions given   Dressing type: bandage and petrolatum    Specimen 1 - Surgical pathology Differential Diagnosis: Hemangioma vs other Check Margins: No 0.5cm red-purple papule  Return for pending pathology.   I, Emelia Salisbury, CMA, am acting as scribe for Sarina Ser, MD.  Documentation: I have reviewed the above documentation for accuracy and completeness, and I agree with the above.  Sarina Ser, MD

## 2019-08-25 ENCOUNTER — Encounter: Payer: Self-pay | Admitting: Dermatology

## 2019-08-26 ENCOUNTER — Telehealth: Payer: Self-pay

## 2019-08-26 NOTE — Telephone Encounter (Signed)
-----   Message from Ralene Bathe, MD sent at 08/22/2019  6:52 PM EDT ----- Skin , left lower cutaneous lip ANGIOMA, INFLAMED  Benign collection of blood vessels As suspected No further treatment needed

## 2019-08-26 NOTE — Telephone Encounter (Signed)
LM on VM to return my call. 

## 2019-08-28 ENCOUNTER — Encounter: Payer: Self-pay | Admitting: Dermatology

## 2019-08-29 ENCOUNTER — Encounter: Payer: Self-pay | Admitting: Dermatology

## 2019-09-02 ENCOUNTER — Other Ambulatory Visit: Payer: Self-pay

## 2019-09-02 ENCOUNTER — Ambulatory Visit: Payer: BC Managed Care – PPO | Attending: Physician Assistant | Admitting: Physical Therapy

## 2019-09-02 DIAGNOSIS — M5441 Lumbago with sciatica, right side: Secondary | ICD-10-CM | POA: Insufficient documentation

## 2019-09-02 DIAGNOSIS — M62838 Other muscle spasm: Secondary | ICD-10-CM | POA: Insufficient documentation

## 2019-09-02 DIAGNOSIS — M25561 Pain in right knee: Secondary | ICD-10-CM | POA: Insufficient documentation

## 2019-09-02 DIAGNOSIS — G8929 Other chronic pain: Secondary | ICD-10-CM | POA: Insufficient documentation

## 2019-09-02 DIAGNOSIS — R102 Pelvic and perineal pain: Secondary | ICD-10-CM | POA: Insufficient documentation

## 2019-09-02 DIAGNOSIS — R2689 Other abnormalities of gait and mobility: Secondary | ICD-10-CM | POA: Insufficient documentation

## 2019-09-02 NOTE — Therapy (Signed)
[place] MAIN [facility] SERVICES [address] [place], [place], [address] Phone: [phone]   Fax:  [phone]  Physical Therapy Treatment  Patient Details  Name: Jason Petersen MRN: [id number] Date of Birth: 1975-06-25 Referring Provider (PT): Vaillancourt   Encounter Date: 09/02/2019   PT End of Session - 09/02/19 0827    Visit Number 11    Number of Visits 20    Date for PT Re-Evaluation 10/08/19    PT Start Time 0812    PT Stop Time 0902    PT Time Calculation (min) 50 min    Activity Tolerance Patient tolerated treatment well           Past Medical History:  Diagnosis Date  . Asthma     Past Surgical History:  Procedure Laterality Date  . COLONOSCOPY WITH PROPOFOL N/A 05/10/2019   Procedure: COLONOSCOPY WITH PROPOFOL;  Surgeon: Lucilla Lame, MD;  Location: [facility] ENDOSCOPY;  Service: Endoscopy;  Laterality: N/A;  . ESOPHAGOGASTRODUODENOSCOPY (EGD) WITH PROPOFOL N/A 05/10/2019   Procedure: ESOPHAGOGASTRODUODENOSCOPY (EGD) WITH PROPOFOL;  Surgeon: Lucilla Lame, MD;  Location: [facility] ENDOSCOPY;  Service: Endoscopy;  Laterality: N/A;  . NASAL SINUS SURGERY    . SINUS SURGERY WITH INSTATRAK    . tubs in ears      There were no vitals filed for this visit.   Subjective Assessment - 09/02/19 0816    Subjective Pt returns to PT after one month. Pt has f/u with eye doctor for blurry vision and will be getting new glasses soon. Pt has f/u re: sinus infections/ dizziness with MDs as his sinus infection / dizziness got worse despite sinus medications  so pt has a scheduled appt with a neurologist in Sept.  CT scan ordered by ENT MD Dr. Virgia Land noted deviated septum. BPPV was ruled out. Hearing test was performed for the tinnitis with a little loss of hearing.   Pt reports when he turns his neck to the R with driving, he experiences snapping and occasionally has radiating pain down the R arm on the outside of arm to elbow.  Pt reports his HA  originate from his neck . HIs HA has worsened over the past month to the point of occuring daily. The dizziness has gone away. The pressure in his head has gone away by 99%.  Pelvic pain has had no improvement. The scrotum has gotten worst in the past week. 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  Back at his last appt in June, pelvic pain had improved to a 2-3 /10 and now it is at 6-7/10. Pt kept up with HEP for improved posture and pelvic mm tightness. R knee pain is much better as it is no longer stopping him from doing activities. Pt is able to step down and exercise in the pool without pain.  Pt reports his CLBP has completely resolved.  Pt has worked on increasing his sleep from 3-4 hours to 5 hours but he still feels he is not rested upon waking. Sleep issues have been present with all his other issues across the past months.    Pertinent History Physical activity: walking 1.5 miles atleast 4 x week, 30 min on elliptical,  dumbbells 20 lbs each hand:  arm / chest standing with feet side by side.  Pt performed sit up and crunches during June and July and felt a "pop like a muscle folding onto another"  in the abdomen by  diaphragm. Hernai was r/o by GI. GERD started after this incident. Pt stopped doing sit up/ crunches. 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              [facility] PT Assessment - 09/02/19 0822      Observation/Other Assessments   Observations less forward head posture       AROM   Overall AROM Comments  rotation cervical R 45 deg, L 60 L   .  R sideflexion 55deg, L 50 deg      Palpation   Spinal mobility no deviations at T3     SI assessment  levelled ASIS                       Pelvic Floor Special Questions - 09/02/19 1214    External Perineal Exam through clothingR ischial rami  attachments               [facility] Adult PT Treatment/Exercise - 09/02/19 1212      Therapeutic Activites    Other Therapeutic Activities active listening for updates on his HA. blurried vision, sinus issues, interdisciplinary care, sleep issues discussed to f/u sleep study       Exercises   Exercises --   see pt instructions: release quad mm / pelvic floor mm tight     Manual Therapy   Manual therapy comments STM/MWM at R ischial rami attachments to minimize pelvic floor tightness                        PT Long Term Goals - 09/02/19 0824      PT LONG TERM GOAL #1   Title Pt will report no pinching ache at  the point below R scrotum, decreased penile pain from 7/10 to < 2/10 after sexual activities    Baseline 6/29:  4/10 at this spot but pt has not had sexual intercourse.  Pain was a nagging pain prior to PT, now it occurs randomly    Time 8    Period Weeks    Status On-going      PT LONG TERM GOAL #2   Title Pt will report no flare up of R hip pain across 1 month and be IND with complimentary stretches to minimize overuse of back mm from repeated turning at work in order to return to walking and ADLs    Time 6    Period Weeks    Status Achieved      PT LONG TERM GOAL #3   Title Pt report being able to sit for > 1 hour without relying on large cushion in order to resume community activities and be able to drive without pillow    Time 4    Period Weeks    Status Achieved      PT LONG TERM GOAL #4   Title Pt will report no R knee pain nor radiating to buttocks pain across 2 weeks in order to perform ADLs and physical routine    Baseline 6/29:  buttock pain has resolved but R knee pain is occuring most days    Time 10    Period Weeks    Status Partially Met      PT LONG TERM GOAL #5  Title Pt will report no more straining with bowel movements 100% of the time, daily bowel movements instead of every other day in order to maintain pelvic health    Baseline  6/29:  daily BMs, 98% of the time with no straining    Time 4    Period Weeks    Status Achieved      PT LONG TERM GOAL #6   Title Pt will report less urinary urgency from 1-2 x every 2 hours to 1 x every 2 hours in order to work and attend community events    Baseline 6/29:  1 x every 1 hour on some days    Time 6    Period Weeks    Status Partially Met      PT LONG TERM GOAL #7   Title Pt will demo less abdominal separation and IND with deep core coordination and strengthening to optimize IAP system for all pelvic floor functions ( digestion, motility, bowel/ urninary function, sexual function)    Time 8    Period Weeks    Status Achieved      PT LONG TERM GOAL #8   Title Pt will demo improved eccentric control with step down test in order to minimzie knee pain when navigating stairs and hills    Time 6    Period Weeks    Status New      PT LONG TERM GOAL  #9   TITLE Pt will return to Elliptical walking without knee pain to maintain aerobic health    Time 10    Status New                 Plan - 09/02/19 0827    Clinical Impression Statement Pt returns to PT after one month.  MSK updates: R knee pain is much better as it is no longer stopping him from doing activities. Pt is able to step down and exercise in the pool without pain.  Pt reports his CLBP has completely resolved.   Pelvic pain increased during the past month when pt did not receive PT due to keeping up with his other medical appts. Pt kept up his HEP but was not able to continue with regular walking routine. The scrotum has gotten worst in the past week. 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  Back at his last appt in June, pelvic pain had improved to a 2-3 /10 and now it is at 6-7/10.   Today reassessed pelvic floor mm which showed increased tightness on R. The corrections made to address his past spinal deviations and curves are being maintained with pt's compliance to HEP.  Anticipate pelvic pain relapse of Sx will continue to improve.   Interdisciplinary update:  Pt has f/u with eye doctor for blurry vision and will be getting new glasses soon. Pt has f/u re: sinus infections/ dizziness with MDs as his sinus infection / dizziness got worse despite sinus medications  so pt has a scheduled appt with a neurologist in Sept. Pt reports when he turns his neck to the R with driving, he experiences snapping and occasionally has radiating pain down the R arm on the outside of arm to elbow.  Pt reports his HA originate from his neck . His HA has worsened over the past month to the point of occuring daily. The dizziness has gone away. The pressure in his head has gone away by 99%.   Pt  has worked on increasing his sleep from 3-4 hours to 5 hours but he still feels he is not rested upon waking. Sleep issues have been present with all his other issues across the past months.  DPT discussed the importance of getting a sleep study to rule in/out OSA.   Pt continues to benefit from skilled PT.      Examination-Activity Limitations Squat;Bend;Sit    Stability/Clinical Decision Making Evolving/Moderate complexity    Rehab Potential Good    PT Frequency 1x / week    PT Duration Other (comment)   10   PT Treatment/Interventions Moist Heat;Therapeutic activities;Therapeutic exercise;Balance training;Neuromuscular re-education;Manual techniques;Patient/family education;Stair training;Energy conservation;Manual lymph drainage;Taping;Dry needling;Vestibular    Consulted and Agree with Plan of Care Patient           Patient will benefit from skilled therapeutic intervention in order to improve the following deficits and impairments:  Decreased strength, Decreased balance, Postural dysfunction, Hypomobility, Impaired flexibility, Improper body mechanics, Pain, Decreased endurance, Decreased coordination, Abnormal gait, Difficulty walking, Increased muscle spasms, Decreased mobility,  Impaired sensation, Decreased activity tolerance, Increased fascial restricitons, Decreased safety awareness  Visit Diagnosis: Pelvic pain  Other muscle spasm  Other abnormalities of gait and mobility     Problem List Patient Active Problem List   Diagnosis Date Noted  . Hematochezia   . Polyp of sigmoid colon   . Gastroesophageal reflux disease without esophagitis 11/12/2018  . Glucosuria 11/12/2018  . Mild persistent asthma without complication 32/41/9914    Jerl Mina ,PT, DPT, E-RYT  09/02/2019, 12:17 PM  [place] MAIN [facility] SERVICES [address] [place], [place], [address] Phone: [phone]   Fax:  [phone]  Name: Jason Petersen MRN: [id number] Date of Birth: [birth year]

## 2019-09-02 NOTE — Patient Instructions (Signed)
Quad stretch seated and sidelying

## 2019-09-04 ENCOUNTER — Telehealth: Payer: Self-pay

## 2019-09-04 NOTE — Telephone Encounter (Signed)
-----   Message from Ralene Bathe, MD sent at 08/22/2019  6:52 PM EDT ----- Skin , left lower cutaneous lip ANGIOMA, INFLAMED  Benign collection of blood vessels As suspected No further treatment needed

## 2019-09-04 NOTE — Telephone Encounter (Signed)
Patient informed of pathology results 

## 2019-09-05 ENCOUNTER — Other Ambulatory Visit: Payer: Self-pay | Admitting: *Deleted

## 2019-09-05 DIAGNOSIS — N411 Chronic prostatitis: Secondary | ICD-10-CM

## 2019-09-05 NOTE — Progress Notes (Signed)
09/06/2019 9:19 AM   Jason Petersen Apr 07, 1975 [id number]  Referring provider: Derinda Late, MD [phone] [facility] [place],  [place] [address] Chief Complaint  Patient presents with  . Follow-up    testicular pain    HPI: Jason Petersen is a 44 y.o. male who returns today to be evaluated for testicular pain and urinary difficulty.   He was first evaluated on 02/22/2019.  At that time, he reported that his symptoms had slightly improved on ciprofloxacin x30 days.  He had also recently been prescribed meloxicam, however he had not started taking it yet.  Scrotal ultrasound with Doppler on 10/30/2018 revealed a small right hydrocele with no other significant findings. I recommended continued supportive care, meloxicam, and Flomax 0.4 mg daily x1 month.  Patient is in physical therapy regularly and noted some symptomatic improvement.   More recently however, testicular pain and urinary difficulty have been persistent x 2 weeks. He has a lot frequency in the mornings. He hs not had coffee in months. He stopped Flomax. He has alternating weak and strong streams.    He has sharp penile pain with erections for months. He notes his penis and testicles (R>L) are swollen. Denies curvature.  He reports that he and his wife recently lost a pregnancy.  They want to try again but he is having penile pain that is prohibitive.  He is also concerned today about his urinary frequency.  This becoming more more bothersome to him.  He gets up several times at night and also will void up to every 30 minutes early in the morning.  He does not drink coffee.   PMH: Past Medical History:  Diagnosis Date  . Asthma     Surgical History: Past Surgical History:  Procedure Laterality Date  . COLONOSCOPY WITH PROPOFOL N/A 05/10/2019   Procedure: COLONOSCOPY WITH PROPOFOL;  Surgeon: Lucilla Lame, MD;  Location: [facility] ENDOSCOPY;  Service: Endoscopy;  Laterality:  N/A;  . ESOPHAGOGASTRODUODENOSCOPY (EGD) WITH PROPOFOL N/A 05/10/2019   Procedure: ESOPHAGOGASTRODUODENOSCOPY (EGD) WITH PROPOFOL;  Surgeon: Lucilla Lame, MD;  Location: [facility] ENDOSCOPY;  Service: Endoscopy;  Laterality: N/A;  . NASAL SINUS SURGERY    . SINUS SURGERY WITH INSTATRAK    . tubs in ears      Home Medications:  Allergies as of 09/06/2019   No Known Allergies     Medication List       Accurate as of September 06, 2019  9:19 AM. If you have any questions, ask your nurse or doctor.        STOP taking these medications   meloxicam 15 MG tablet Commonly known as: MOBIC Stopped by: [name], MD   omeprazole 40 MG capsule Commonly known as: PRILOSEC Stopped by: [name], MD   tamsulosin 0.4 MG Caps capsule Commonly known as: Flomax Stopped by: [name], MD     TAKE these medications   beclomethasone 80 MCG/ACT inhaler Commonly known as: QVAR Inhale into the lungs.   pantoprazole 40 MG tablet Commonly known as: PROTONIX Take 1 tablet (40 mg total) by mouth daily.       Allergies: No Known Allergies  Family History: No family history on file.  Social History:  reports that he has never smoked. He has never used smokeless tobacco. He reports current alcohol use. He reports that he does not use drugs.   Physical Exam: BP 136/77   Pulse 74   Ht 5\' 11"  (1.803 m)  Wt 219 lb (99.3 kg)   BMI 30.54 kg/m   Constitutional:  Alert and oriented, No acute distress. HEENT: [place] AT, moist mucus membranes.  Trachea midline, no masses. Cardiovascular: No clubbing, cyanosis, or edema. Respiratory: Normal respiratory effort, no increased work of breathing. GI: Abdomen is soft, nontender, nondistended, no abdominal masses GU: No CVA tenderness.  No bladder fullness or masses.  Patient with circumcised phallus, no palpable penile plaques. Urethral meatus is patent.  No penile discharge. No penile lesions or rashes. Scrotum without lesions, cysts, rashes and/or  edema.  Testicles are located scrotally bilaterally. No masses are appreciated in the testicles. Left and right epididymis are normal. Skin: No rashes, bruises or suspicious lesions. Neurologic: Grossly intact, no focal deficits, moving all 4 extremities. Psychiatric: Normal mood and affect.   Assessment & Plan:    1. Urinary frequency Unclear if this is related to behavioral, prostatic or OAB.  We discussed options for further evaluation including cystoscopy/TRUS to further define his prostatic anatomy, urodynamics.  He is interested in pursuing cystoscopy/TRUS.   We also discussed treatment options would be trial and error since he is most interested in a pharmaceutical route.   I suspect pelvic floor dysfunction and strongly advised patient to continue physical therapy.  -Given 4 Myrbetriq 25 mg daily, # 28 samples; I have advised the patient of the side effects of Myrbetriq, such as: elevation in BP, urinary retention and/or HA   -Will schedule cystoscopy and TRUS.   2. Chronic testicular/pelvic pain Normal physical exam.  3. Penile pain Patient presented with a picture today showing ballooning area of the mid left corpora which he felt was the area of pain at the time of erection. Physical exam showed no plaque, I suspect possible micr oinjury and peyronie's disease without curvature.  Discussed this will likely be somewhat difficult to treat, consider steroid injection as an option He will consider this and let us know If in fact this is related to early Perrone's disease, likely improved with time  No follow-ups on file.  [place] [address], [place], [place] [address] [phone]  I, Selena Batten, am acting as a scribe for Dr. [name].  I have reviewed the above documentation for accuracy and completeness, and I agree with the above.   [name], MD   I spent 30 total minutes on the day of the encounter  including pre-visit review of the medical record, face-to-face time with the patient, and post visit ordering of labs/imaging/tests.

## 2019-09-06 ENCOUNTER — Encounter: Payer: Self-pay | Admitting: Urology

## 2019-09-06 ENCOUNTER — Other Ambulatory Visit
Admission: RE | Admit: 2019-09-06 | Discharge: 2019-09-06 | Disposition: A | Discharge status: Home / Self Care | Payer: BC Managed Care – PPO | Source: Ambulatory Visit | Attending: Urology | Admitting: Urology

## 2019-09-06 ENCOUNTER — Other Ambulatory Visit: Payer: Self-pay

## 2019-09-06 ENCOUNTER — Ambulatory Visit (INDEPENDENT_AMBULATORY_CARE_PROVIDER_SITE_OTHER): Payer: BC Managed Care – PPO | Admitting: Urology

## 2019-09-06 VITALS — BP 136/77 | HR 74 | Ht 71.0 in | Wt 219.0 lb

## 2019-09-06 DIAGNOSIS — R1031 Right lower quadrant pain: Secondary | ICD-10-CM

## 2019-09-06 DIAGNOSIS — G8929 Other chronic pain: Secondary | ICD-10-CM

## 2019-09-06 DIAGNOSIS — N411 Chronic prostatitis: Secondary | ICD-10-CM | POA: Diagnosis present

## 2019-09-06 DIAGNOSIS — N4889 Other specified disorders of penis: Secondary | ICD-10-CM

## 2019-09-06 DIAGNOSIS — R350 Frequency of micturition: Secondary | ICD-10-CM | POA: Diagnosis not present

## 2019-09-06 LAB — URINALYSIS, COMPLETE (UACMP) WITH MICROSCOPIC
Bacteria, UA: NONE SEEN
Bilirubin Urine: NEGATIVE
Glucose, UA: NEGATIVE mg/dL
Hgb urine dipstick: NEGATIVE
Ketones, ur: NEGATIVE mg/dL
Leukocytes,Ua: NEGATIVE
Nitrite: NEGATIVE
Protein, ur: NEGATIVE mg/dL
Specific Gravity, Urine: 1.01 (ref 1.005–1.030)
Squamous Epithelial / HPF: NONE SEEN (ref 0–5)
WBC, UA: NONE SEEN WBC/hpf (ref 0–5)
pH: 6 (ref 5.0–8.0)

## 2019-09-11 ENCOUNTER — Ambulatory Visit: Payer: BC Managed Care – PPO | Admitting: Physical Therapy

## 2019-09-11 ENCOUNTER — Other Ambulatory Visit: Payer: Self-pay

## 2019-09-11 DIAGNOSIS — M62838 Other muscle spasm: Secondary | ICD-10-CM

## 2019-09-11 DIAGNOSIS — R102 Pelvic and perineal pain: Secondary | ICD-10-CM | POA: Diagnosis not present

## 2019-09-11 DIAGNOSIS — R2689 Other abnormalities of gait and mobility: Secondary | ICD-10-CM

## 2019-09-11 NOTE — Patient Instructions (Signed)
Ankle stretch and   Feet slides to improve dorsiflexion for more pre swing./ push off to miinimize overuse of pelvic floor mm  __      On belly: Riding horse edge of mattress  knee bent like riding a horse, move knee towards armpit and out  10 reps   ___

## 2019-09-11 NOTE — Therapy (Signed)
[place] [facility] SERVICES [address] [place], [place], [address] Phone: [phone]   Fax:  [phone]  Physical Therapy Treatment  Patient Details  Name: Jason Petersen MRN: [id number] Date of Birth: 1975-12-24 Referring Provider (PT): Vaillancourt   Encounter Date: 09/11/2019   PT End of Session - 09/11/19 0900    Visit Number 12    Number of Visits 20    Date for PT Re-Evaluation 10/08/19    PT Start Time 0806    PT Stop Time 0900    PT Time Calculation (min) 54 min    Activity Tolerance Patient tolerated treatment well           Past Medical History:  Diagnosis Date  . Asthma     Past Surgical History:  Procedure Laterality Date  . COLONOSCOPY WITH PROPOFOL N/A 05/10/2019   Procedure: COLONOSCOPY WITH PROPOFOL;  Surgeon: Lucilla Lame, MD;  Location: [facility] ENDOSCOPY;  Service: Endoscopy;  Laterality: N/A;  . ESOPHAGOGASTRODUODENOSCOPY (EGD) WITH PROPOFOL N/A 05/10/2019   Procedure: ESOPHAGOGASTRODUODENOSCOPY (EGD) WITH PROPOFOL;  Surgeon: Lucilla Lame, MD;  Location: [facility] ENDOSCOPY;  Service: Endoscopy;  Laterality: N/A;  . NASAL SINUS SURGERY    . SINUS SURGERY WITH INSTATRAK    . tubs in ears      There were no vitals filed for this visit.   Subjective Assessment - 09/11/19 0809    Subjective Pt felt pain relief with pelvic pain after last session 3-4/10  which lasted 2 days. The pelvic pain returned a the same level 2 days later at 4/10  but it was not at the level it was prior to PT 6/10.    Pt has kep up with walking.    Pertinent History Physical activity: walking 1.5 miles atleast 4 x week, 30 min on elliptical,  dumbbells 20 lbs each hand:  arm / chest standing with feet side by side.  Pt performed sit up and crunches during June and July and felt a "pop like a muscle folding onto another"  in the abdomen by diaphragm. Hernai was r/o by GI. GERD started after this incident. Pt stopped doing sit up/ crunches. 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              [facility] PT Assessment - 09/11/19 0852      Palpation   Palpation comment hypomobile midfoot due to high arches  B       Ambulation/Gait   Gait Comments minimal push off , limited DF                       Pelvic Floor Special Questions - 09/11/19 0851    External Perineal Exam hooklying: R obt int , deep and transverse perineal tightness     Pelvic Floor Internal Exam pt concented verbally without contraindications     Exam Type Rectal    Palpation R sidelying, 1 oclock reproduced pelvic pain.  tightness at puiborectalis 12-6 o clock                           PT Long Term Goals - 09/02/19 0824      PT LONG TERM GOAL #1   Title Pt will report  no pinching ache at  the point below R scrotum, decreased penile pain from 7/10 to < 2/10 after sexual activities    Baseline 6/29:  4/10 at this spot but pt has not had sexual intercourse.  Pain was a nagging pain prior to PT, now it occurs randomly    Time 8    Period Weeks    Status On-going      PT LONG TERM GOAL #2   Title Pt will report no flare up of R hip pain across 1 month and be IND with complimentary stretches to minimize overuse of back mm from repeated turning at work in order to return to walking and ADLs    Time 6    Period Weeks    Status Achieved      PT LONG TERM GOAL #3   Title Pt report being able to sit for > 1 hour without relying on large cushion in order to resume community activities and be able to drive without pillow    Time 4    Period Weeks    Status Achieved      PT LONG TERM GOAL #4   Title Pt will report no R knee pain nor radiating to buttocks pain across 2 weeks in order to perform ADLs and physical routine    Baseline 6/29:  buttock pain has resolved but R knee pain is occuring  most days    Time 10    Period Weeks    Status Partially Met      PT LONG TERM GOAL #5   Title Pt will report no more straining with bowel movements 100% of the time, daily bowel movements instead of every other day in order to maintain pelvic health    Baseline 6/29:  daily BMs, 98% of the time with no straining    Time 4    Period Weeks    Status Achieved      PT LONG TERM GOAL #6   Title Pt will report less urinary urgency from 1-2 x every 2 hours to 1 x every 2 hours in order to work and attend community events    Baseline 6/29:  1 x every 1 hour on some days    Time 6    Period Weeks    Status Partially Met      PT LONG TERM GOAL #7   Title Pt will demo less abdominal separation and IND with deep core coordination and strengthening to optimize IAP system for all pelvic floor functions ( digestion, motility, bowel/ urninary function, sexual function)    Time 8    Period Weeks    Status Achieved      PT LONG TERM GOAL #8   Title Pt will demo improved eccentric control with step down test in order to minimzie knee pain when navigating stairs and hills    Time 6    Period Weeks    Status New      PT LONG TERM GOAL  #9   TITLE Pt will return to Elliptical walking without knee pain to maintain aerobic health    Time 10    Status New                 Plan - 09/11/19 0900    Clinical Impression Statement Pt responded well to last session with pelvic pain relief with decreased pain but it lasted for 2 days. Pt demo'd increased pelvic floor mm through rectal internal assessment. Pain was  reproduced at R obturator internus which was decreased post Tx. Pt also benefited from external Tx to minimize R obturator internus and deep/superficial transverse perineal mm. Applied regional interdependent approach to minimize pelvic floor overactivity. Noted pt 's high arches, limited DF, poor pre-swing/ push off in gait. Provided manual Tx to promote ankle DF. Pt continues to walk  regularly and anticipate lower kinetic chain improvements will help pt continue his walking routine and minimize pelvic pain.  Pt continues benefit skilled PT     Examination-Activity Limitations Squat;Bend;Sit    Stability/Clinical Decision Making Evolving/Moderate complexity    Rehab Potential Good    PT Frequency 1x / week    PT Duration Other (comment)   10   PT Treatment/Interventions Moist Heat;Therapeutic activities;Therapeutic exercise;Balance training;Neuromuscular re-education;Manual techniques;Patient/family education;Stair training;Energy conservation;Manual lymph drainage;Taping;Dry needling;Vestibular    Consulted and Agree with Plan of Care Patient           Patient will benefit from skilled therapeutic intervention in order to improve the following deficits and impairments:  Decreased strength, Decreased balance, Postural dysfunction, Hypomobility, Impaired flexibility, Improper body mechanics, Pain, Decreased endurance, Decreased coordination, Abnormal gait, Difficulty walking, Increased muscle spasms, Decreased mobility, Impaired sensation, Decreased activity tolerance, Increased fascial restricitons, Decreased safety awareness  Visit Diagnosis: Pelvic pain  Other muscle spasm  Other abnormalities of gait and mobility     Problem List Patient Active Problem List   Diagnosis Date Noted  . Hematochezia   . Polyp of sigmoid colon   . Gastroesophageal reflux disease without esophagitis 11/12/2018  . Glucosuria 11/12/2018  . Mild persistent asthma without complication 01/65/5374    Jerl Mina ,PT, DPT, E-RYT  09/11/2019, 10:12 AM  [place] [facility]IN [facility] SERVICES [address] [place], [place], [address] Phone: [phone]   Fax:  [phone]  Name: Elex Mainwaring MRN: [id number] Date of Birth: November 29, 1975

## 2019-09-16 ENCOUNTER — Ambulatory Visit: Payer: BC Managed Care – PPO | Admitting: Physical Therapy

## 2019-09-16 ENCOUNTER — Other Ambulatory Visit: Payer: Self-pay

## 2019-09-16 DIAGNOSIS — G8929 Other chronic pain: Secondary | ICD-10-CM

## 2019-09-16 DIAGNOSIS — R102 Pelvic and perineal pain: Secondary | ICD-10-CM

## 2019-09-16 DIAGNOSIS — R2689 Other abnormalities of gait and mobility: Secondary | ICD-10-CM

## 2019-09-16 DIAGNOSIS — M62838 Other muscle spasm: Secondary | ICD-10-CM

## 2019-09-16 DIAGNOSIS — M5441 Lumbago with sciatica, right side: Secondary | ICD-10-CM

## 2019-09-16 NOTE — Patient Instructions (Addendum)
Dolphin plank by the wall 20 reps at work breaks     __  on back:  tuck chin, rotate R, chin down and look into armpits  10 reps   ___  Jason Petersen on L side when eating to decrease jaw tightness on R   Lower jaw and wiggle side to side during the day

## 2019-09-16 NOTE — Therapy (Signed)
[place] MAIN [facility] SERVICES [address] [place], [place], [address] Phone: [phone]   Fax:  [phone]  Physical Therapy Treatment  Patient Details  Name: Jason Petersen MRN: [id number] Date of Birth: 03/18/1975 Referring Provider (PT): Vaillancourt   Encounter Date: 09/16/2019   PT End of Session - 09/16/19 0902    Visit Number 13    Number of Visits 20    Date for PT Re-Evaluation 10/08/19    PT Start Time 0809    PT Stop Time 0903    PT Time Calculation (min) 54 min    Activity Tolerance Patient tolerated treatment well           Past Medical History:  Diagnosis Date  . Asthma     Past Surgical History:  Procedure Laterality Date  . COLONOSCOPY WITH PROPOFOL N/A 05/10/2019   Procedure: COLONOSCOPY WITH PROPOFOL;  Surgeon: Lucilla Lame, MD;  Location: [facility] ENDOSCOPY;  Service: Endoscopy;  Laterality: N/A;  . ESOPHAGOGASTRODUODENOSCOPY (EGD) WITH PROPOFOL N/A 05/10/2019   Procedure: ESOPHAGOGASTRODUODENOSCOPY (EGD) WITH PROPOFOL;  Surgeon: Lucilla Lame, MD;  Location: [facility] ENDOSCOPY;  Service: Endoscopy;  Laterality: N/A;  . NASAL SINUS SURGERY    . SINUS SURGERY WITH INSTATRAK    . tubs in ears      There were no vitals filed for this visit.   Subjective Assessment - 09/16/19 0811    Subjective Pt reported his pelvic pain has decreased from 4/10 to 2/10. No changes in his neck pain which involves cracking when turning to the R and causes headasches. Pt has a neurologist appt in Sept    Pertinent History Physical activity: walking 1.5 miles atleast 4 x week, 30 min on elliptical,  dumbbells 20 lbs each hand:  arm / chest standing with feet side by side.  Pt performed sit up and crunches during June and July and felt a "pop like a muscle folding onto another"  in the abdomen by diaphragm. Hernai was r/o by GI. GERD started after this incident. Pt stopped doing sit up/ crunches. 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              [facility] PT Assessment - 09/16/19 0830      AROM   Overall AROM Comments sideflexion cervical R 50 deg, L 45 deg, Rotation R 60 deg, L 65 deg ( Sideflexion R cause slight pain, cracking with R rotation)   PostTx: R 55deg, Rotation R 65 deg )      Palpation   Spinal mobility no deviations at T3     Palpation comment pterygoid, occiput oblique capitus R tightness                       Pelvic Floor Special Questions - 09/16/19 4401    Pelvic Floor Internal Exam pt concented verbally without contraindications     Exam Type Rectal    Palpation L sidelying, L 7 o'clcock  8-10 o clock EAS no tenderness , only mild tightness             [facility] Adult PT Treatment/Exercise - 09/16/19 0856      Exercises   Exercises --   stretch for neck, cued for cervical/ scap stab new HEP  Manual Therapy   Manual therapy comments STM/MWM at pterygoid, occiput oblique capitus R tightness     Internal Pelvic Floor STM/MWM L 7 o'clcock  8-10 o clock EAS                        PT Long Term Goals - 09/02/19 3329      PT LONG TERM GOAL #1   Title Pt will report no pinching ache at  the point below R scrotum, decreased penile pain from 7/10 to < 2/10 after sexual activities    Baseline 6/29:  4/10 at this spot but pt has not had sexual intercourse.  Pain was a nagging pain prior to PT, now it occurs randomly    Time 8    Period Weeks    Status On-going      PT LONG TERM GOAL #2   Title Pt will report no flare up of R hip pain across 1 month and be IND with complimentary stretches to minimize overuse of back mm from repeated turning at work in order to return to walking and ADLs    Time 6    Period Weeks    Status Achieved      PT LONG TERM GOAL #3   Title Pt report being able to sit for > 1 hour without relying on  large cushion in order to resume community activities and be able to drive without pillow    Time 4    Period Weeks    Status Achieved      PT LONG TERM GOAL #4   Title Pt will report no R knee pain nor radiating to buttocks pain across 2 weeks in order to perform ADLs and physical routine    Baseline 6/29:  buttock pain has resolved but R knee pain is occuring most days    Time 10    Period Weeks    Status Partially Met      PT LONG TERM GOAL #5   Title Pt will report no more straining with bowel movements 100% of the time, daily bowel movements instead of every other day in order to maintain pelvic health    Baseline 6/29:  daily BMs, 98% of the time with no straining    Time 4    Period Weeks    Status Achieved      PT LONG TERM GOAL #6   Title Pt will report less urinary urgency from 1-2 x every 2 hours to 1 x every 2 hours in order to work and attend community events    Baseline 6/29:  1 x every 1 hour on some days    Time 6    Period Weeks    Status Partially Met      PT LONG TERM GOAL #7   Title Pt will demo less abdominal separation and IND with deep core coordination and strengthening to optimize IAP system for all pelvic floor functions ( digestion, motility, bowel/ urninary function, sexual function)    Time 8    Period Weeks    Status Achieved      PT LONG TERM GOAL #8   Title Pt will demo improved eccentric control with step down test in order to minimzie knee pain when navigating stairs and hills    Time 6    Period Weeks    Status New      PT LONG TERM GOAL  #9   TITLE Pt will return  to Elliptical walking without knee pain to maintain aerobic health    Time 10    Status New                 Plan - 09/16/19 0902    Clinical Impression Statement Pt showed good carry over with minimal pelvic floor mm tightness which has contributed to pt's report of decreased pelvic pain. Further assessed pt's neck which showed tighntess at pterygoids and occiput mm on  R. Pt no longer shows forward head posture but still requires stabilization of cervical/ scapular mm to compliment pt's repeated task of looking into a microscope at work which creates a forward head/ hunched posture. Also likely pt's preference to chew on R side 2/2 Hx of tooth extraction on L is likely contributing to R jaw tightness and tightness at pterygoids and occiput mm on R. Rotation and sideflexion improved post Tx. Anticipate that as pt's improvements with upright posture and mm imbalances, pt 's Sx will continue to improve. Pt continues to benefit from skilled PT       Examination-Activity Limitations Squat;Bend;Sit    Stability/Clinical Decision Making Evolving/Moderate complexity    Rehab Potential Good    PT Frequency 1x / week    PT Duration Other (comment)   10   PT Treatment/Interventions Moist Heat;Therapeutic activities;Therapeutic exercise;Balance training;Neuromuscular re-education;Manual techniques;Patient/family education;Stair training;Energy conservation;Manual lymph drainage;Taping;Dry needling;Vestibular    Consulted and Agree with Plan of Care Patient           Patient will benefit from skilled therapeutic intervention in order to improve the following deficits and impairments:  Decreased strength, Decreased balance, Postural dysfunction, Hypomobility, Impaired flexibility, Improper body mechanics, Pain, Decreased endurance, Decreased coordination, Abnormal gait, Difficulty walking, Increased muscle spasms, Decreased mobility, Impaired sensation, Decreased activity tolerance, Increased fascial restricitons, Decreased safety awareness  Visit Diagnosis: No diagnosis found.     Problem List Patient Active Problem List   Diagnosis Date Noted  . Hematochezia   . Polyp of sigmoid colon   . Gastroesophageal reflux disease without esophagitis 11/12/2018  . Glucosuria 11/12/2018  . Mild persistent asthma without complication 60/05/5995    Jerl Mina ,PT,  DPT, E-RYT  09/16/2019, 9:04 AM  [place] MAIN [facility] SERVICES [address] [place], [place], [address] Phone: [phone]   Fax:  [phone]  Name: Jason Petersen MRN: [id number] Date of Birth: July 13, 1975

## 2019-09-23 ENCOUNTER — Other Ambulatory Visit: Payer: Self-pay

## 2019-09-23 ENCOUNTER — Ambulatory Visit: Payer: BC Managed Care – PPO | Admitting: Physical Therapy

## 2019-09-23 DIAGNOSIS — R102 Pelvic and perineal pain: Secondary | ICD-10-CM

## 2019-09-23 DIAGNOSIS — M5441 Lumbago with sciatica, right side: Secondary | ICD-10-CM

## 2019-09-23 DIAGNOSIS — M25561 Pain in right knee: Secondary | ICD-10-CM

## 2019-09-23 DIAGNOSIS — R2689 Other abnormalities of gait and mobility: Secondary | ICD-10-CM

## 2019-09-23 DIAGNOSIS — M62838 Other muscle spasm: Secondary | ICD-10-CM

## 2019-09-23 NOTE — Therapy (Signed)
[place] MAIN [facility] SERVICES [address] [place], [place], [address] Phone: [phone]   Fax:  [phone]  Physical Therapy Treatment  Patient Details  Name: Jason Petersen MRN: [id number] Date of Birth: 25-Apr-1975 Referring Provider (PT): Vaillancourt   Encounter Date: 09/23/2019   PT End of Session - 09/23/19 0859    Visit Number 14    Number of Visits 20    Date for PT Re-Evaluation 10/08/19    PT Start Time 0805    PT Stop Time 0858    PT Time Calculation (min) 53 min    Activity Tolerance Patient tolerated treatment well           Past Medical History:  Diagnosis Date  . Asthma     Past Surgical History:  Procedure Laterality Date  . COLONOSCOPY WITH PROPOFOL N/A 05/10/2019   Procedure: COLONOSCOPY WITH PROPOFOL;  Surgeon: Lucilla Lame, MD;  Location: [facility] ENDOSCOPY;  Service: Endoscopy;  Laterality: N/A;  . ESOPHAGOGASTRODUODENOSCOPY (EGD) WITH PROPOFOL N/A 05/10/2019   Procedure: ESOPHAGOGASTRODUODENOSCOPY (EGD) WITH PROPOFOL;  Surgeon: Lucilla Lame, MD;  Location: [facility] ENDOSCOPY;  Service: Endoscopy;  Laterality: N/A;  . NASAL SINUS SURGERY    . SINUS SURGERY WITH INSTATRAK    . tubs in ears      There were no vitals filed for this visit.   Subjective Assessment - 09/23/19 0806    Subjective Pt reported his pelvic pain has stayed 1-2/10.    Pertinent History Physical activity: walking 1.5 miles atleast 4 x week, 30 min on elliptical,  dumbbells 20 lbs each hand:  arm / chest standing with feet side by side.  Pt performed sit up and crunches during June and July and felt a "pop like a muscle folding onto another"  in the abdomen by diaphragm. Hernai was r/o by GI. GERD started after this incident. Pt stopped doing sit up/ crunches. 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                [facility] PT Assessment - 09/23/19 0810      Observation/Other Assessments   Observations high arches, hypomobile mid foot, tightness at hamstring/ adductors B         AROM   Overall AROM Comments sideflexion cervical R 40 deg, L 50 deg      Strength   Overall Strength Comments hip abd B 5/5, hip ext 4-/5 B        Palpation   Spinal mobility no spinal deviations    Palpation comment no tightness at occiput                          [facility] Adult PT Treatment/Exercise - 09/23/19 0845      Manual Therapy   Manual therapy comments STM/MWM at B adductors/ hamstrings,  distraction at [facility] joint, AP/PA mob at midfoot to promote DF, STM/MWM at hamstrings/ adductors B                         PT Long Term Goals - 09/23/19 0092      PT LONG TERM GOAL #1   Title Pt will report no pinching ache at  the point below R scrotum, decreased penile  pain from 7/10 to < 2/10 after sexual activities    Baseline 6/29:  4/10 at this spot but pt has not had sexual intercourse.  Pain was a nagging pain prior to PT, now it occurs randomly    Time 8    Period Weeks    Status On-going      PT LONG TERM GOAL #2   Title Pt will report no flare up of R hip pain across 1 month and be IND with complimentary stretches to minimize overuse of back mm from repeated turning at work in order to return to walking and ADLs    Time 6    Period Weeks    Status Achieved      PT LONG TERM GOAL #3   Title Pt report being able to sit for > 1 hour without relying on large cushion in order to resume community activities and be able to drive without pillow    Time 4    Period Weeks    Status Achieved      PT LONG TERM GOAL #4   Title Pt will report no R knee pain nor radiating to buttocks pain across 2 weeks in order to perform ADLs and physical routine    Baseline 6/29:  buttock pain has resolved but R knee pain is occuring most days    Time 10    Period Weeks    Status Partially  Met      PT LONG TERM GOAL #5   Title Pt will report no more straining with bowel movements 100% of the time, daily bowel movements instead of every other day in order to maintain pelvic health    Baseline 6/29:  daily BMs, 98% of the time with no straining    Time 4    Period Weeks    Status Achieved      PT LONG TERM GOAL #6   Title Pt will report less urinary urgency from 1-2 x every 2 hours to 1 x every 2 hours in order to work and attend community events    Baseline 6/29:  1 x every 1 hour on some days  ( 8/23: mostly in the mornings, daily)    Time 6    Period Weeks    Status Partially Met      PT LONG TERM GOAL #7   Title Pt will demo less abdominal separation and IND with deep core coordination and strengthening to optimize IAP system for all pelvic floor functions ( digestion, motility, bowel/ urninary function, sexual function)    Time 8    Period Weeks    Status Achieved      PT LONG TERM GOAL #8   Title Pt will demo improved eccentric control with step down test in order to minimzie knee pain when navigating stairs and hills    Time 6    Period Weeks    Status On-going      PT LONG TERM GOAL  #9   TITLE Pt will return to Elliptical walking without knee pain to maintain aerobic health    Time 10    Status On-going                 Plan - 09/23/19 0859    Clinical Impression Statement Pt demo'd significantly decreased pelvic floor mm tightness and continues to report 1-2/10 pain levels.  Neck mm tightness decreased and spinal alignment remains in place. Pt continues to report dizziness and seeing floaters despite  improved mobility and alignment at cervical spine. Plan to withhold further Tx until pt consults neurologist in 2 weeks.   Manual Tx was applied at lower kinetic chain where pt demo'd tightness at adductors/ hamstrings/ high arches. Pt showed IND with stretches after manual Tx. Plan to help pt achieve less R knee pain and progrerss to elliptical walking.   Pt continues to benefit from skilled PT       Examination-Activity Limitations Squat;Bend;Sit    Stability/Clinical Decision Making Evolving/Moderate complexity    Rehab Potential Good    PT Frequency 1x / week    PT Duration Other (comment)   10   PT Treatment/Interventions Moist Heat;Therapeutic activities;Therapeutic exercise;Balance training;Neuromuscular re-education;Manual techniques;Patient/family education;Stair training;Energy conservation;Manual lymph drainage;Taping;Dry needling;Vestibular    Consulted and Agree with Plan of Care Patient           Patient will benefit from skilled therapeutic intervention in order to improve the following deficits and impairments:  Decreased strength, Decreased balance, Postural dysfunction, Hypomobility, Impaired flexibility, Improper body mechanics, Pain, Decreased endurance, Decreased coordination, Abnormal gait, Difficulty walking, Increased muscle spasms, Decreased mobility, Impaired sensation, Decreased activity tolerance, Increased fascial restricitons, Decreased safety awareness  Visit Diagnosis: Pelvic pain  Chronic right-sided low back pain with right-sided sciatica  Other abnormalities of gait and mobility  Other muscle spasm  Acute pain of right knee     Problem List Patient Active Problem List   Diagnosis Date Noted  . Hematochezia   . Polyp of sigmoid colon   . Gastroesophageal reflux disease without esophagitis 11/12/2018  . Glucosuria 11/12/2018  . Mild persistent asthma without complication 48/25/0037    Jerl Mina ,PT, DPT, E-RYT  09/23/2019, 9:01 AM  [place] MAIN [facility] SERVICES [address] [place], [place], [address] Phone: [phone]   Fax:  [phone]  Name: Jason Petersen MRN: [id number] Date of Birth: 16-Jul-1975

## 2019-09-23 NOTE — Patient Instructions (Signed)
Stretches : (Cuing provided for proper alignment)  Stretches for your legs: LAYING on Back Use upper arms and elbows for stability when pulling strap Opposite knee bent and foot firm in align with hip   Strap on ballmound:  Hip socket  _strap, L knee bent, R ballmound against strap and spread toes, rolling foot 15 deg out and in across midline.  10 reps each side   Hamstring _knee bends  10 reps  With knee pointing straight     10 reps with knee pointing out towards armpit   Adductors and pelvic floor ( Happy Baby) : Delane Ginger are wide towards armpits, sole of feet towards ceiling   IT band _scoot hips to R, cross R leg over L and straighten knee with strap on ballmound,    Quad in sidelying _strap around the ankle, pulling ankle towards buttocks  Bottom leg firm and stabilization with knee bent   ___   Standing lunge for more dorsiflexion at the ankle

## 2019-09-30 ENCOUNTER — Other Ambulatory Visit: Payer: Self-pay

## 2019-09-30 ENCOUNTER — Ambulatory Visit: Payer: BC Managed Care – PPO | Admitting: Physical Therapy

## 2019-09-30 DIAGNOSIS — R2689 Other abnormalities of gait and mobility: Secondary | ICD-10-CM

## 2019-09-30 DIAGNOSIS — R102 Pelvic and perineal pain: Secondary | ICD-10-CM | POA: Diagnosis not present

## 2019-09-30 DIAGNOSIS — M25561 Pain in right knee: Secondary | ICD-10-CM

## 2019-09-30 DIAGNOSIS — M62838 Other muscle spasm: Secondary | ICD-10-CM

## 2019-09-30 DIAGNOSIS — M5441 Lumbago with sciatica, right side: Secondary | ICD-10-CM

## 2019-09-30 NOTE — Therapy (Addendum)
[place] MAIN [facility] SERVICES [address] [place], [place], [address] Phone: [phone]   Fax:  [phone]  Physical Therapy Treatment  Patient Details  Name: Jason Petersen MRN: [id number] Date of Birth: 03-01-1975 Referring Provider (PT): Vaillancourt   Encounter Date: 09/30/2019   PT End of Session - 09/30/19 0820    Visit Number 15    Number of Visits 20    Date for PT Re-Evaluation 10/08/19    PT Start Time 0810    PT Stop Time 0900    PT Time Calculation (min) 50 min    Activity Tolerance Patient tolerated treatment well           Past Medical History:  Diagnosis Date  . Asthma     Past Surgical History:  Procedure Laterality Date  . COLONOSCOPY WITH PROPOFOL N/A 05/10/2019   Procedure: COLONOSCOPY WITH PROPOFOL;  Surgeon: Lucilla Lame, MD;  Location: [facility] ENDOSCOPY;  Service: Endoscopy;  Laterality: N/A;  . ESOPHAGOGASTRODUODENOSCOPY (EGD) WITH PROPOFOL N/A 05/10/2019   Procedure: ESOPHAGOGASTRODUODENOSCOPY (EGD) WITH PROPOFOL;  Surgeon: Lucilla Lame, MD;  Location: [facility] ENDOSCOPY;  Service: Endoscopy;  Laterality: N/A;  . NASAL SINUS SURGERY    . SINUS SURGERY WITH INSTATRAK    . tubs in ears      There were no vitals filed for this visit.   Subjective Assessment - 09/30/19 0814    Subjective Pelvic pain is better, there is just specific area at 3-4 oclock that he felt during the rectal assessment that still comes on randomly. It feels like an ache and pinchy feeling. It is way better than it was. Previously, it felt like a "burning hot needle sticking into one nerve". Pt reported R knee pain is better and only bothering him with walking and stairs.    Pertinent History Physical activity: walking 1.5 miles atleast 4 x week, 30 min on elliptical,  dumbbells 20 lbs each hand:  arm / chest standing with feet side by side.  Pt performed sit up and crunches during June and July and felt a "pop like a muscle folding onto another"   in the abdomen by diaphragm. Hernai was r/o by GI. GERD started after this incident. Pt stopped doing sit up/ crunches. 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              [facility] PT Assessment - 09/30/19 0855      Palpation   Palpation comment tightness at vastus medialis, adductor magnus R,                       Pelvic Floor Special Questions - 09/30/19 0855    External Perineal Exam tenderness/ tightness over pubic symphysis, anterior triangle by pubic ramic    Pelvic Floor Internal Exam pt concented verbally without contraindications     Exam Type Rectal    Palpation L sidelying, cued for lengthening, minor pelvic floor tightness on R              [facility] Adult PT Treatment/Exercise - 09/30/19 0856      Modalities   Modalities Moist Heat      Moist Heat Therapy   Number Minutes Moist Heat 5 Minutes    Moist Heat Location --   perineum  Manual Therapy   Manual therapy comments STM/MWM  vastus medialis, adductor magnus R,externally at pubic symphysis/ rami to decrease mm tightness     Internal Pelvic Floor assessed for tightness                      PT Long Term Goals - 09/30/19 0859      PT LONG TERM GOAL #1   Title Pt will report no pinching ache at  the point below R scrotum, decreased penile pain from 7/10 to < 2/10 after sexual activities    Baseline 6/29:  4/10 at this spot but pt has not had sexual intercourse.  Pain was a nagging pain prior to PT, now it occurs randomly    Time 8    Period Weeks    Status On-going      PT LONG TERM GOAL #2   Title Pt will report no flare up of R hip pain across 1 month and be IND with complimentary stretches to minimize overuse of back mm from repeated turning at work in order to return to walking and ADLs    Time 6    Period Weeks    Status  Achieved      PT LONG TERM GOAL #3   Title Pt report being able to sit for > 1 hour without relying on large cushion in order to resume community activities and be able to drive without pillow    Time 4    Period Weeks    Status Achieved      PT LONG TERM GOAL #4   Title Pt will report no R knee pain nor radiating to buttocks pain across 2 weeks in order to perform ADLs and physical routine    Baseline 6/29:  buttock pain has resolved but R knee pain is occuring most days    Time 10    Period Weeks    Status Partially Met      PT LONG TERM GOAL #5   Title Pt will report no more straining with bowel movements 100% of the time, daily bowel movements instead of every other day in order to maintain pelvic health    Baseline 6/29:  daily BMs, 98% of the time with no straining    Time 4    Period Weeks    Status Achieved      PT LONG TERM GOAL #6   Title Pt will report less urinary urgency from 1-2 x every 2 hours to 1 x every 2 hours in order to work and attend community events    Baseline 6/29:  1 x every 1 hour on some days  ( 8/23: mostly in the mornings, daily,  8/30: once every 2 hours )    Time 6    Period Weeks    Status Achieved      PT LONG TERM GOAL #7   Title Pt will demo less abdominal separation and IND with deep core coordination and strengthening to optimize IAP system for all pelvic floor functions ( digestion, motility, bowel/ urninary function, sexual function)    Time 8    Period Weeks    Status Achieved      PT LONG TERM GOAL #8   Title Pt will demo improved eccentric control with step down test in order to minimzie knee pain when navigating stairs and hills    Time 6    Period Weeks    Status On-going  PT LONG TERM GOAL  #9   TITLE Pt will return to Elliptical walking without knee pain to maintain aerobic health    Time 10    Status On-going                 Plan - 09/30/19 0940    Clinical Impression Statement Pt demo'd signficantly  decreased pelvic floor mm ttightness and remaining localized pain at R side was not found upon palpation and reassesment through rectal exam.  Focused today on decreasing mm tightness at vastus medialis, adductor magnus R, and mm by pubic symphysis, anterior triangle mm by pubic rami. Pt's gait improvements are showing good carry over with longer stride and increased hip flexion/ knee flexion.  Anticipate these improvements will help pt minimize R knee pain with walking. Plan to help pt progress with walking and health and maintenance goals with upcoming sessions.   Pt continues to benefit from skilled PT.   Interdisciplinary update:  Pt has an upcoming appt with a neurologist re: dizziness which started in Nov.   Denied head trauma. Pt reports tinnitis and recently increased sensitivity to sounds. Pt reports HA from nose bridge to back of the neck.They occur once every day or every other day. Pt sees floaters all the time.Pt has been on a new medication a couple months ago. This dizziness started in Nov and worsened the past 4 weeks.  Denied head trauma recently. Pt reports tinnitis and recently increased sensitivity to sounds. Pt reports HA from nose bridge to back of the neck.They occur once every day or every other day. Pt sees floaters all the time.Pt has been on a new medication a couple months ago.     Pt arrived 10 min, thus session was abbreviated        Examination-Activity Limitations Squat;Bend;Sit    Stability/Clinical Decision Making Evolving/Moderate complexity    Rehab Potential Good    PT Frequency 1x / week    PT Duration Other (comment)   10   PT Treatment/Interventions Moist Heat;Therapeutic activities;Therapeutic exercise;Balance training;Neuromuscular re-education;Manual techniques;Patient/family education;Stair training;Energy conservation;Manual lymph drainage;Taping;Dry needling;Vestibular    Consulted and Agree with Plan of Care Patient           Patient will  benefit from skilled therapeutic intervention in order to improve the following deficits and impairments:  Decreased strength, Decreased balance, Postural dysfunction, Hypomobility, Impaired flexibility, Improper body mechanics, Pain, Decreased endurance, Decreased coordination, Abnormal gait, Difficulty walking, Increased muscle spasms, Decreased mobility, Impaired sensation, Decreased activity tolerance, Increased fascial restricitons, Decreased safety awareness  Visit Diagnosis: Pelvic pain  Chronic right-sided low back pain with right-sided sciatica  Other abnormalities of gait and mobility  Other muscle spasm  Acute pain of right knee     Problem List Patient Active Problem List   Diagnosis Date Noted  . Hematochezia   . Polyp of sigmoid colon   . Gastroesophageal reflux disease without esophagitis 11/12/2018  . Glucosuria 11/12/2018  . Mild persistent asthma without complication 76/80/8811    Jerl Mina ,PT, DPT, E-RYT  09/30/2019, 9:00 AM  [place] MAIN [facility] SERVICES [address] [place], [place], [address] Phone: [phone]   Fax:  [phone]  Name: Jason Petersen MRN: [id number] Date of Birth: [birth year]

## 2019-10-01 ENCOUNTER — Ambulatory Visit (INDEPENDENT_AMBULATORY_CARE_PROVIDER_SITE_OTHER): Payer: BC Managed Care – PPO | Admitting: Urology

## 2019-10-01 ENCOUNTER — Other Ambulatory Visit: Payer: Self-pay

## 2019-10-01 VITALS — BP 136/85 | HR 66 | Wt 219.0 lb

## 2019-10-01 DIAGNOSIS — R350 Frequency of micturition: Secondary | ICD-10-CM

## 2019-10-01 DIAGNOSIS — M6289 Other specified disorders of muscle: Secondary | ICD-10-CM

## 2019-10-01 DIAGNOSIS — N411 Chronic prostatitis: Secondary | ICD-10-CM | POA: Diagnosis not present

## 2019-10-01 LAB — URINALYSIS, COMPLETE
Bilirubin, UA: NEGATIVE
Glucose, UA: NEGATIVE
Ketones, UA: NEGATIVE
Leukocytes,UA: NEGATIVE
Nitrite, UA: NEGATIVE
Protein,UA: NEGATIVE
RBC, UA: NEGATIVE
Specific Gravity, UA: 1.02 (ref 1.005–1.030)
Urobilinogen, Ur: 0.2 mg/dL (ref 0.2–1.0)
pH, UA: 6 (ref 5.0–7.5)

## 2019-10-01 LAB — MICROSCOPIC EXAMINATION
Bacteria, UA: NONE SEEN
Epithelial Cells (non renal): NONE SEEN /hpf (ref 0–10)
RBC, Urine: NONE SEEN /hpf (ref 0–2)

## 2019-10-01 NOTE — Progress Notes (Signed)
10/01/19  Chief Complaint  Patient presents with  . Cysto     HPI: 44 year old male with refractory urinary symptoms (urinary frequency), possible BPH who presents today to the office for cystoscopy and prostate sizing.  He also has some suspected pelvic floor dysfunction.  He has been working with physical therapy on this.   Please see previous notes for details.    Since last visit, he is improving.  His urinary frequency is subsided and he never ended up taking the Myrbetriq.  He has on hand in case he starts having urinary symptoms.  He does note that he has been working diligently with physical therapy and also change his diet somewhat.   There were no vitals taken for this visit. NED. A&Ox3.   No respiratory distress   Abd soft, NT, ND Normal phallus with bilateral descended testicles    Cystoscopy Procedure Note  Patient identification was confirmed, informed consent was obtained, and patient was prepped using Betadine solution.  Lidocaine jelly was administered per urethral meatus.    Preoperative abx where received prior to procedure.     Pre-Procedure: - Inspection reveals a normal caliber ureteral meatus.  Procedure: The flexible cystoscope was introduced without difficulty - No urethral strictures/lesions are present. - Normal prostate  - Normal bladder neck - Bilateral ureteral orifices identified - Bladder mucosa  reveals no ulcers, tumors, or lesions - No bladder stones - No trabeculation  Retroflexion shows no evidence of median lobe or intravesical protrusion   Post-Procedure: - Patient tolerated the procedure well    Prostate transrectal ultrasound sizing   Informed consent was obtained after discussing risks/benefits of the procedure.  A time out was performed to ensure correct patient identity.   Pre-Procedure: -Transrectal probe was placed without difficulty -Transrectal Ultrasound performed revealing a 40.9 gm prostate measuring 2.85 x  5.48 x 5.01 cm (length) -No significant hypoechoic or median lobe noted      Assessment/ Plan:  1. Chronic prostatitis Currently asymptomatic - Urinalysis, Complete  2. Pelvic floor dysfunction Working with physical therapy, seeing some improvement Prostate is relatively small, urethra and bladder are normal Suspect symptoms are less likely secondary to BPH  3. Urinary frequency Currently doing well, has Myrbetriq samples in case his symptoms worsen  Follow-up in 6 months for IPSS/PVR    [name], MD

## 2019-10-04 ENCOUNTER — Other Ambulatory Visit (HOSPITAL_COMMUNITY): Payer: Self-pay | Admitting: Neurology

## 2019-10-04 ENCOUNTER — Other Ambulatory Visit: Payer: Self-pay | Admitting: Neurology

## 2019-10-04 DIAGNOSIS — M5481 Occipital neuralgia: Secondary | ICD-10-CM

## 2019-10-04 DIAGNOSIS — M542 Cervicalgia: Secondary | ICD-10-CM

## 2019-10-10 ENCOUNTER — Other Ambulatory Visit: Payer: Self-pay

## 2019-10-10 ENCOUNTER — Ambulatory Visit: Payer: BC Managed Care – PPO | Attending: Physician Assistant | Admitting: Physical Therapy

## 2019-10-10 DIAGNOSIS — M62838 Other muscle spasm: Secondary | ICD-10-CM | POA: Diagnosis present

## 2019-10-10 DIAGNOSIS — G8929 Other chronic pain: Secondary | ICD-10-CM | POA: Insufficient documentation

## 2019-10-10 DIAGNOSIS — M25561 Pain in right knee: Secondary | ICD-10-CM | POA: Diagnosis present

## 2019-10-10 DIAGNOSIS — M5441 Lumbago with sciatica, right side: Secondary | ICD-10-CM | POA: Diagnosis present

## 2019-10-10 DIAGNOSIS — R102 Pelvic and perineal pain: Secondary | ICD-10-CM

## 2019-10-10 DIAGNOSIS — R2689 Other abnormalities of gait and mobility: Secondary | ICD-10-CM | POA: Diagnosis present

## 2019-10-10 NOTE — Therapy (Addendum)
[place] MAIN [facility] SERVICES [address] [place], [place], [address] Phone: [phone]   Fax:  [phone]  Physical Therapy Treatment / Progress Note   Patient Details  Name: Jason Petersen MRN: [id number] Date of Birth: 1975-02-18 Referring Provider (PT): Vaillancourt   Encounter Date: 10/10/2019   PT End of Session - 10/10/19 1121    Visit Number 16    Number of Visits 20    Date for PT Re-Evaluation 12/19/19    PT Start Time 0907    PT Stop Time 0955    PT Time Calculation (min) 48 min    Activity Tolerance Patient tolerated treatment well           Past Medical History:  Diagnosis Date  . Asthma     Past Surgical History:  Procedure Laterality Date  . COLONOSCOPY WITH PROPOFOL N/A 05/10/2019   Procedure: COLONOSCOPY WITH PROPOFOL;  Surgeon: Lucilla Lame, MD;  Location: [facility] ENDOSCOPY;  Service: Endoscopy;  Laterality: N/A;  . ESOPHAGOGASTRODUODENOSCOPY (EGD) WITH PROPOFOL N/A 05/10/2019   Procedure: ESOPHAGOGASTRODUODENOSCOPY (EGD) WITH PROPOFOL;  Surgeon: Lucilla Lame, MD;  Location: [facility] ENDOSCOPY;  Service: Endoscopy;  Laterality: N/A;  . NASAL SINUS SURGERY    . SINUS SURGERY WITH INSTATRAK    . tubs in ears      There were no vitals filed for this visit.   Subjective Assessment - 10/10/19 0912    Subjective Pt reported he saw Dr. Manuella Ghazi and had a workup, with an MRI scheduled. Lab work showed low Vitamin D levels and hight antibodies. Pt reported last week, all his symptoms with  his acid reflux, dizziness, pelvic floor pain increased with increased stress at work and home.The increased pain lasted for 4 days. The levels of pain has dropped back to the levels of improvement at 1-2/10 levels. Pt noticed his body tensing up last week and being conscious of it, helped to relax his body.    Pertinent History Physical activity: walking 1.5 miles atleast 4 x week, 30 min on elliptical,  dumbbells 20 lbs each hand:  arm / chest  standing with feet side by side.  Pt performed sit up and crunches during June and July and felt a "pop like a muscle folding onto another"  in the abdomen by diaphragm. Hernai was r/o by GI. GERD started after this incident. Pt stopped doing sit up/ crunches. 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              [facility] PT Assessment - 10/10/19 0916      PROM   Overall PROM Comments supine: DF 25 deg L , 15 deg R, pre Tx, R 25 deg R post Tx      Strength   Overall Strength Comments PF without UE support, 5/5 ,  23 rep B        Ambulation/Gait   Stairs --   naroow BOS ascension/ descension, poor eccentric control                      Pelvic Floor Special Questions - 10/10/19 4650    External Perineal Exam tenderness/ tightness: L ischiocavernosus, B bulbospongiosus              [facility] Adult PT Treatment/Exercise -  10/10/19 4114      Neuro Re-ed    Neuro Re-ed Details  cued for ankle stretch with strap,       Manual Therapy   Manual therapy comments  STM/MWM  L ischiocavernosus, B bulbospongiosus, R foot/ankle:, mobilizations to promote DF/EV            Therapeutic Activites:  Stair assessment, explained the use of relaxation tools to apply when stressed to minimize body tensions related to relapse of pelvic floor pain            PT Long Term Goals - 09/30/19 0859      PT LONG TERM GOAL #1   Title Pt will report no pinching ache at  the point below R scrotum, decreased penile pain from 7/10 to < 2/10 after sexual activities    Baseline 6/29:  4/10 at this spot but pt has not had sexual intercourse.  Pain was a nagging pain prior to PT, now it occurs randomly    Time 8    Period Weeks    Status On-going      PT LONG TERM GOAL #2   Title Pt will report no flare up of R hip pain across 1 month and be IND  with complimentary stretches to minimize overuse of back mm from repeated turning at work in order to return to walking and ADLs    Time 6    Period Weeks    Status Achieved      PT LONG TERM GOAL #3   Title Pt report being able to sit for > 1 hour without relying on large cushion in order to resume community activities and be able to drive without pillow    Time 4    Period Weeks    Status Achieved      PT LONG TERM GOAL #4   Title Pt will report no R knee pain nor radiating to buttocks pain across 2 weeks in order to perform ADLs and physical routine    Baseline 6/29:  buttock pain has resolved but R knee pain is occuring most days    Time 10    Period Weeks    Status Partially Met      PT LONG TERM GOAL #5   Title Pt will report no more straining with bowel movements 100% of the time, daily bowel movements instead of every other day in order to maintain pelvic health    Baseline 6/29:  daily BMs, 98% of the time with no straining    Time 4    Period Weeks    Status Achieved      PT LONG TERM GOAL #6   Title Pt will report less urinary urgency from 1-2 x every 2 hours to 1 x every 2 hours in order to work and attend community events    Baseline 6/29:  1 x every 1 hour on some days  ( 8/23: mostly in the mornings, daily,  8/30: once every 2 hours )    Time 6    Period Weeks    Status Achieved      PT LONG TERM GOAL #7   Title Pt will demo less abdominal separation and IND with deep core coordination and strengthening to optimize IAP system for all pelvic floor functions ( digestion, motility, bowel/ urninary function, sexual function)    Time 8    Period Weeks    Status Achieved      PT LONG TERM GOAL #8  Title Pt will demo improved eccentric control with step down test in order to minimzie knee pain when navigating stairs and hills    Time 6    Period Weeks    Status On-going      PT LONG TERM GOAL  #9   TITLE Pt will return to Elliptical walking without knee pain to  maintain aerobic health    Time 10    Status On-going                 Plan - 10/10/19 1122    Clinical Impression Statement  Pt has achieved 5/9 goals and progressing well towards remaining goals. Pt has had decreased pelvic pain at 1-2/10 levels and is able to maintain more relaxed pelvic floor mm and deep core coordination/ strengthening. Pt's spinal curves and pelvic girdle misalignment/ SIJ hypomobility have been corrected and pt demo'd more upright posture with more thoracolumbar and deep core strength. Diastasis recti has resolved. Currently working on R knee pain which is related to limited DF ROM. Manual Tx today helped increase DF mobility which helped decrease pain with descending stairs. Anticipate pt will be able to achieve remaining goals including having no knee pain on the Elliptical machine.   Pt is working with a neurologist re: his dizziness, seeing floaters, auras, HA, neck issues.   Pt continues to benefit from skilled PT.        Examination-Activity Limitations Squat;Bend;Sit    Stability/Clinical Decision Making Evolving/Moderate complexity    Rehab Potential Good    PT Frequency 1x / week    PT Duration Other (comment)   10   PT Treatment/Interventions Moist Heat;Therapeutic activities;Therapeutic exercise;Balance training;Neuromuscular re-education;Manual techniques;Patient/family education;Stair training;Energy conservation;Manual lymph drainage;Taping;Dry needling;Vestibular    Consulted and Agree with Plan of Care Patient           Patient will benefit from skilled therapeutic intervention in order to improve the following deficits and impairments:  Decreased strength, Decreased balance, Postural dysfunction, Hypomobility, Impaired flexibility, Improper body mechanics, Pain, Decreased endurance, Decreased coordination, Abnormal gait, Difficulty walking, Increased muscle spasms, Decreased mobility, Impaired sensation, Decreased activity tolerance, Increased  fascial restricitons, Decreased safety awareness  Visit Diagnosis: Pelvic pain  Chronic right-sided low back pain with right-sided sciatica  Other abnormalities of gait and mobility  Other muscle spasm  Acute pain of right knee     Problem List Patient Active Problem List   Diagnosis Date Noted  . Hematochezia   . Polyp of sigmoid colon   . Gastroesophageal reflux disease without esophagitis 11/12/2018  . Glucosuria 11/12/2018  . Mild persistent asthma without complication 43/83/8184    Jerl Mina ,PT, DPT, E-RYT  10/10/2019, 11:23 AM  [place] MAIN [facility] SERVICES [address] [place], [place], [address] Phone: [phone]   Fax:  [phone]  Name: Rashed Edler MRN: [id number] Date of Birth: Jun 30, 1975

## 2019-10-14 ENCOUNTER — Emergency Department: Payer: BC Managed Care – PPO

## 2019-10-14 ENCOUNTER — Other Ambulatory Visit: Payer: Self-pay

## 2019-10-14 ENCOUNTER — Emergency Department
Admission: EM | Admit: 2019-10-14 | Discharge: 2019-10-14 | Disposition: A | Discharge status: Home / Self Care | Payer: BC Managed Care – PPO | Attending: Emergency Medicine | Admitting: Emergency Medicine

## 2019-10-14 DIAGNOSIS — R1013 Epigastric pain: Secondary | ICD-10-CM

## 2019-10-14 DIAGNOSIS — K8020 Calculus of gallbladder without cholecystitis without obstruction: Secondary | ICD-10-CM | POA: Diagnosis not present

## 2019-10-14 DIAGNOSIS — J45909 Unspecified asthma, uncomplicated: Secondary | ICD-10-CM | POA: Diagnosis not present

## 2019-10-14 DIAGNOSIS — K219 Gastro-esophageal reflux disease without esophagitis: Secondary | ICD-10-CM | POA: Insufficient documentation

## 2019-10-14 DIAGNOSIS — Z79899 Other long term (current) drug therapy: Secondary | ICD-10-CM | POA: Insufficient documentation

## 2019-10-14 DIAGNOSIS — Z8719 Personal history of other diseases of the digestive system: Secondary | ICD-10-CM | POA: Diagnosis not present

## 2019-10-14 LAB — CBC WITH DIFFERENTIAL/PLATELET
Abs Immature Granulocytes: 0.08 10*3/uL — ABNORMAL HIGH (ref 0.00–0.07)
Basophils Absolute: 0.1 10*3/uL (ref 0.0–0.1)
Basophils Relative: 0 %
Eosinophils Absolute: 0.1 10*3/uL (ref 0.0–0.5)
Eosinophils Relative: 0 %
HCT: 44.9 % (ref 39.0–52.0)
Hemoglobin: 15.1 g/dL (ref 13.0–17.0)
Immature Granulocytes: 1 %
Lymphocytes Relative: 11 %
Lymphs Abs: 1.7 10*3/uL (ref 0.7–4.0)
MCH: 29.3 pg (ref 26.0–34.0)
MCHC: 33.6 g/dL (ref 30.0–36.0)
MCV: 87 fL (ref 80.0–100.0)
Monocytes Absolute: 0.6 10*3/uL (ref 0.1–1.0)
Monocytes Relative: 4 %
Neutro Abs: 12.3 10*3/uL — ABNORMAL HIGH (ref 1.7–7.7)
Neutrophils Relative %: 84 %
Platelets: 205 10*3/uL (ref 150–400)
RBC: 5.16 MIL/uL (ref 4.22–5.81)
RDW: 11.9 % (ref 11.5–15.5)
WBC: 14.7 10*3/uL — ABNORMAL HIGH (ref 4.0–10.5)
nRBC: 0 % (ref 0.0–0.2)

## 2019-10-14 LAB — COMPREHENSIVE METABOLIC PANEL
ALT: 36 U/L (ref 0–44)
AST: 20 U/L (ref 15–41)
Albumin: 4.4 g/dL (ref 3.5–5.0)
Alkaline Phosphatase: 66 U/L (ref 38–126)
Anion gap: 11 (ref 5–15)
BUN: 14 mg/dL (ref 6–20)
CO2: 25 mmol/L (ref 22–32)
Calcium: 9.1 mg/dL (ref 8.9–10.3)
Chloride: 101 mmol/L (ref 98–111)
Creatinine, Ser: 1.02 mg/dL (ref 0.61–1.24)
GFR calc Af Amer: >60 mL/min (ref >60)
GFR calc non Af Amer: >60 mL/min (ref >60)
Glucose, Bld: 124 mg/dL — ABNORMAL HIGH (ref 70–99)
Potassium: 3.9 mmol/L (ref 3.5–5.1)
Sodium: 137 mmol/L (ref 135–145)
Total Bilirubin: 1.1 mg/dL (ref 0.3–1.2)
Total Protein: 7.2 g/dL (ref 6.5–8.1)

## 2019-10-14 LAB — LIPASE, BLOOD: Lipase: 25 U/L (ref 11–51)

## 2019-10-14 LAB — TROPONIN I (HIGH SENSITIVITY)
Troponin I (High Sensitivity): 5 ng/L (ref <18)
Troponin I (High Sensitivity): 4 ng/L (ref <18)

## 2019-10-14 MED ORDER — SUCRALFATE 1 GM/10ML PO SUSP: 1.0000 g | Freq: Four times a day (QID) | ORAL | 1 refills | Status: DC

## 2019-10-14 MED ORDER — ALUM & MAG HYDROXIDE-SIMETH 200-200-20 MG/5ML PO SUSP
30.0000 mL | Freq: Once | ORAL | Status: AC
  Administered 2019-10-14: 30 mL via ORAL

## 2019-10-14 MED ORDER — DICYCLOMINE HCL 10 MG PO CAPS: 10.0000 mg | ORAL_CAPSULE | Freq: Four times a day (QID) | ORAL | 0 refills | Status: DC

## 2019-10-14 MED ORDER — LIDOCAINE VISCOUS HCL 2 % MT SOLN
15.0000 mL | Freq: Once | OROMUCOSAL | Status: AC
  Administered 2019-10-14: 15 mL via ORAL

## 2019-10-14 NOTE — ED Provider Notes (Signed)
[facility] Emergency Department Provider Note   ____________________________________________   First MD Initiated Contact with Patient 10/14/19 [phone]     (approximate)  I have reviewed the triage vital signs and the nursing notes.   HISTORY  Chief Complaint Abdominal Pain and Chest Pain    HPI Jason Petersen is a 44 y.o. male with a stated past medical history of GERD who presents for midepigastric abdominal pain that began approximately 12 hours prior to arrival has worsened since onset.  Patient states that it started as a "dull, burning" pain that developed into a "searing pain" that was 10/10, nonradiating, and did not have any exacerbating or relieving factors.  Patient denies ever having pain similar to this in the past.  Patient denies any associated nausea/vomiting, shortness of breath, chest pain, or diarrhea.         Past Medical History:  Diagnosis Date  . Asthma     Patient Active Problem List   Diagnosis Date Noted  . Hematochezia   . Polyp of sigmoid colon   . Gastroesophageal reflux disease without esophagitis 11/12/2018  . Glucosuria 11/12/2018  . Mild persistent asthma without complication 11/94/1740    Past Surgical History:  Procedure Laterality Date  . COLONOSCOPY WITH PROPOFOL N/A 05/10/2019   Procedure: COLONOSCOPY WITH PROPOFOL;  Surgeon: Lucilla Lame, MD;  Location: [facility] ENDOSCOPY;  Service: Endoscopy;  Laterality: N/A;  . ESOPHAGOGASTRODUODENOSCOPY (EGD) WITH PROPOFOL N/A 05/10/2019   Procedure: ESOPHAGOGASTRODUODENOSCOPY (EGD) WITH PROPOFOL;  Surgeon: Lucilla Lame, MD;  Location: [facility] ENDOSCOPY;  Service: Endoscopy;  Laterality: N/A;  . NASAL SINUS SURGERY    . SINUS SURGERY WITH INSTATRAK    . tubs in ears      Prior to Admission medications   Medication Sig Start Date End Date Taking? Authorizing Provider  beclomethasone (QVAR) 80 MCG/ACT inhaler Inhale into the lungs. 11/12/18 11/12/19  [provider]    dicyclomine (BENTYL) 10 MG capsule Take 1 capsule (10 mg total) by mouth 4 (four) times daily for 7 days. 10/14/19 10/21/19  Naaman Plummer, MD  pantoprazole (PROTONIX) 40 MG tablet Take 1 tablet (40 mg total) by mouth daily. 04/22/19   Lucilla Lame, MD  sucralfate (CARAFATE) 1 GM/10ML suspension Take 10 mLs (1 g total) by mouth 4 (four) times daily. 10/14/19 10/13/20  Naaman Plummer, MD    Allergies Patient has no known allergies.  No family history on file.  Social History Social History   Tobacco Use  . Smoking status: Never Smoker  . Smokeless tobacco: Never Used  Substance Use Topics  . Alcohol use: Yes    Comment: occasionally  . Drug use: Never    Review of Systems Constitutional: No fever/chills Eyes: No visual changes. ENT: No sore throat. Cardiovascular: Denies chest pain. Respiratory: Denies shortness of breath. Gastrointestinal: Endorses abdominal pain.  No nausea, no vomiting.  No diarrhea. Genitourinary: Negative for dysuria. Musculoskeletal: Negative for acute arthralgias Skin: Negative for rash. Neurological: Negative for headaches, weakness/numbness/paresthesias in any extremity Psychiatric: Negative for suicidal ideation/homicidal ideation   ____________________________________________   PHYSICAL EXAM:  VITAL SIGNS: ED Triage Vitals  Enc Vitals Group     BP 10/14/19 0437 (!) 117/92     Pulse Rate 10/14/19 0437 76     Resp 10/14/19 0437 20     Temp 10/14/19 0540 98.5 F (36.9 C)     Temp Source 10/14/19 0437 Oral     SpO2 10/14/19 0437 100 %     Weight  10/14/19 0437 220 lb (99.8 kg)     Height 10/14/19 0437 5\' 10"  (1.778 m)     Head Circumference --      Peak Flow --      Pain Score 10/14/19 0437 8     Pain Loc --      Pain Edu? --      Excl. in [place]? --    Constitutional: Alert and oriented. Well appearing and in no acute distress. Eyes: Conjunctivae are normal. PERRL. EOMI. Head: Atraumatic. Nose: No congestion/rhinnorhea. Mouth/Throat:  Mucous membranes are moist. Neck: No stridor Cardiovascular: Normal rate, regular rhythm. Grossly normal heart sounds.  Good peripheral circulation. Respiratory: Normal respiratory effort.  No retractions. Gastrointestinal: Soft, mild midepigastric tenderness to palpation. No distention. Musculoskeletal: No lower extremity tenderness nor edema.  No joint effusions. Neurologic:  Normal speech and language. No gross focal neurologic deficits are appreciated. Skin:  Skin is warm and dry. No rash noted. Psychiatric: Mood and affect are normal. Speech and behavior are normal.  ____________________________________________   LABS (all labs ordered are listed, but only abnormal results are displayed)  Labs Reviewed  COMPREHENSIVE METABOLIC PANEL - Abnormal; Notable for the following components:      Result Value   Glucose, Bld 124 (*)    All other components within normal limits  CBC WITH DIFFERENTIAL/PLATELET - Abnormal; Notable for the following components:   WBC 14.7 (*)    Neutro Abs 12.3 (*)    Abs Immature Granulocytes 0.08 (*)    All other components within normal limits  LIPASE, BLOOD  TROPONIN I (HIGH SENSITIVITY)  TROPONIN I (HIGH SENSITIVITY)   ____________________________________________  EKG  ED ECG REPORT I, Naaman Plummer, the attending physician, personally viewed and interpreted this ECG.  Date: 10/14/2019 EKG Time: 0426 Rate: 76 Rhythm: normal sinus rhythm QRS Axis: normal Intervals: normal ST/T Wave abnormalities: normal Narrative Interpretation: no evidence of acute ischemia  ____________________________________________  RADIOLOGY  ED MD interpretation: Right upper quadrant ultrasound shows numerous gallbladder calculi without evidence of infection  Official radiology report(s): US ABDOMEN LIMITED RUQ  Result Date: 10/14/2019 CLINICAL DATA:  Right upper quadrant pain for 5 hours EXAM: ULTRASOUND ABDOMEN LIMITED RIGHT UPPER QUADRANT COMPARISON:  None.  FINDINGS: Gallbladder: Numerous layering calculi. No wall thickening or focal tenderness. No pericholecystic edema. Common bile duct: Diameter: 3 mm.  Where visualized, no filling defect. Liver: No focal lesion identified. Within normal limits in parenchymal echogenicity. Portal vein is patent on color Doppler imaging with normal direction of blood flow towards the liver. IMPRESSION: Numerous gallbladder calculi.  No evidence of cholecystitis. Electronically Signed   By: Monte Fantasia M.D.   On: 10/14/2019 05:15    ____________________________________________   PROCEDURES  Procedure(s) performed (including Critical Care):  Procedures   ____________________________________________   INITIAL IMPRESSION / ASSESSMENT AND PLAN / ED COURSE        Patient is a 44 year old male who presents for burning midepigastric abdominal pain Presentation most consistent with biliary colic, without evidence of infection. History and exam AAA, pancreatitis, SBO, appendicitis, mesenteric ischemia, nephrolithiasis, pyelonephritis, or diverticulitis.  ED Interventions: analgesia PRN ED Workup: CBC, BMP, LFTs, Lipase, Abdominal US  Disposition: Refer to general surgery outpatient. Discharge home with SRP and instructions for primary care follow up within 24-48 hours.      ____________________________________________   FINAL CLINICAL IMPRESSION(S) / ED DIAGNOSES  Final diagnoses:  Abdominal pain, epigastric  Calculus of gallbladder without cholecystitis without obstruction     ED Discharge Orders  Ordered    sucralfate (CARAFATE) 1 GM/10ML suspension  4 times daily        10/14/19 1036    dicyclomine (BENTYL) 10 MG capsule  4 times daily        10/14/19 1036           Note:  This document was prepared using Dragon voice recognition software and may include unintentional dictation errors.   Naaman Plummer, MD 10/14/19 1036

## 2019-10-14 NOTE — ED Triage Notes (Signed)
Pt states "Searing" pain since midnight to upper abd radiating through to back. Pt states he feels shob. Pt is pwd, with equal pulses to upper extremities. Pt is obviously uncomforable, denies known fever.

## 2019-10-14 NOTE — ED Notes (Signed)
Patient transported to Ultrasound 

## 2019-10-14 NOTE — ED Notes (Signed)
Patient verbalizes understanding of discharge instructions. Opportunity for questioning and answers were provided. Armband removed by staff, pt discharged from ED. Ambulated out to lobby  

## 2019-10-18 ENCOUNTER — Ambulatory Visit: Payer: BC Managed Care – PPO | Admitting: Physical Therapy

## 2019-10-18 ENCOUNTER — Other Ambulatory Visit: Payer: Self-pay

## 2019-10-18 DIAGNOSIS — R102 Pelvic and perineal pain: Secondary | ICD-10-CM

## 2019-10-18 DIAGNOSIS — M25561 Pain in right knee: Secondary | ICD-10-CM

## 2019-10-18 DIAGNOSIS — R2689 Other abnormalities of gait and mobility: Secondary | ICD-10-CM

## 2019-10-18 DIAGNOSIS — G8929 Other chronic pain: Secondary | ICD-10-CM

## 2019-10-18 DIAGNOSIS — M62838 Other muscle spasm: Secondary | ICD-10-CM

## 2019-10-18 NOTE — Therapy (Signed)
[place] MAIN [facility] SERVICES [address] [place], [place], [address] Phone: [phone]   Fax:  [phone]  Physical Therapy Treatment / Discharge Summary  Patient Details  Name: Jason Petersen MRN: [id number] Date of Birth: 1975/03/16 Referring Provider (PT): Vaillancourt   Encounter Date: 10/18/2019   PT End of Session - 10/18/19 0817    Visit Number 17    Number of Visits 20    Date for PT Re-Evaluation 12/19/19    PT Start Time 0811    PT Stop Time 0844    PT Time Calculation (min) 33 min    Activity Tolerance Patient tolerated treatment well           Past Medical History:  Diagnosis Date   Asthma     Past Surgical History:  Procedure Laterality Date   COLONOSCOPY WITH PROPOFOL N/A 05/10/2019   Procedure: COLONOSCOPY WITH PROPOFOL;  Surgeon: Lucilla Lame, MD;  Location: [facility] ENDOSCOPY;  Service: Endoscopy;  Laterality: N/A;   ESOPHAGOGASTRODUODENOSCOPY (EGD) WITH PROPOFOL N/A 05/10/2019   Procedure: ESOPHAGOGASTRODUODENOSCOPY (EGD) WITH PROPOFOL;  Surgeon: Lucilla Lame, MD;  Location: [facility] ENDOSCOPY;  Service: Endoscopy;  Laterality: N/A;   NASAL SINUS SURGERY     SINUS SURGERY WITH INSTATRAK     tubs in ears      There were no vitals filed for this visit.   Subjective Assessment - 10/18/19 0813    Subjective Pt reported his knee has been feeling pretty god after last session. Pt had to go to the ER due to severe stoamch pain and he found out he had gallbladder stones and will be seeing a surgeon next week. Pt has a MRI scheduled for his dizziness. HA, and floaters and will be f/u by Dr. Manuella Ghazi. Since he has taken Vitamin D for the past week as prescribed by Dr. Manuella Ghazi due to low levels, pt reports his dizziness has gone down a bit.    Pertinent History Physical activity: walking 1.5 miles atleast 4 x week, 30 min on elliptical,  dumbbells 20 lbs each hand:  arm / chest standing with feet side by side.  Pt performed sit up  and crunches during June and July and felt a "pop like a muscle folding onto another"  in the abdomen by diaphragm. Hernai was r/o by GI. GERD started after this incident. Pt stopped doing sit up/ crunches. 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              [facility] PT Assessment - 10/18/19 0923      Posture/Postural Control   Posture Comments upright posture       Ambulation/Gait   Stairs --   eccentric control and DF present    Gait Comments longer stride                       Pelvic Floor Special Questions - 10/18/19 0916    Diastasis Recti 1 fingers width along linea alba     External Perineal Exam no tenderness/ tightness of pelvic floor B              [facility] Adult PT Treatment/Exercise - 10/18/19 0916      Therapeutic Activites    Other Therapeutic Activities reassessed goals and discussed d/c  PT Long Term Goals - 10/18/19 0831      PT LONG TERM GOAL #1   Title Pt will report no pinching ache at  the point below R scrotum, decreased penile pain from 7/10 to < 2/10 after sexual activities    Baseline 6/29:  4/10 at this spot but pt has not had sexual intercourse.  Pain was a nagging pain prior to PT, now it occurs randomly    Time 8    Period Weeks    Status Achieved      PT LONG TERM GOAL #2   Title Pt will report no flare up of R hip pain across 1 month and be IND with complimentary stretches to minimize overuse of back mm from repeated turning at work in order to return to walking and ADLs    Time 6    Period Weeks    Status Achieved      PT LONG TERM GOAL #3   Title Pt report being able to sit for > 1 hour without relying on large cushion in order to resume community activities and be able to drive without pillow    Time 4    Period Weeks    Status Achieved        PT LONG TERM GOAL #4   Title Pt will report no R knee pain nor radiating to buttocks pain across 2 weeks in order to perform ADLs and physical routine    Baseline 6/29:  buttock pain has resolved but R knee pain is occuring most   9/17: no pain    Time 10    Period Weeks    Status Achieved      PT LONG TERM GOAL #5   Title Pt will report no more straining with bowel movements 100% of the time, daily bowel movements instead of every other day in order to maintain pelvic health    Baseline 6/29:  daily BMs, 98% of the time with no straining    Time 4    Period Weeks    Status Achieved      PT LONG TERM GOAL #6   Title Pt will report less urinary urgency from 1-2 x every 2 hours to 1 x every 2 hours in order to work and attend community events    Baseline 6/29:  1 x every 1 hour on some days  ( 8/23: mostly in the mornings, daily,  8/30: once every 2 hours )    Time 6    Period Weeks    Status Achieved      PT LONG TERM GOAL #7   Title Pt will demo less abdominal separation and IND with deep core coordination and strengthening to optimize IAP system for all pelvic floor functions ( digestion, motility, bowel/ urninary function, sexual function)    Time 8    Period Weeks    Status Achieved      PT LONG TERM GOAL #8   Title Pt will demo improved eccentric control with step down test in order to minimzie knee pain when navigating stairs and hills    Time 6    Period Weeks    Status Achieved      PT LONG TERM GOAL  #9   TITLE Pt will return to Elliptical walking without knee pain to maintain aerobic health    Time 10    Status Unable to assess  Plan - 10/18/19 0818    Clinical Impression Statement Across the past 17 visits for 4 months, Pt has achieved 100% of his goals related to pelvic pain, LBP, and knee pain. Pt has been able to sit for >1 hr without a cushion and has had no more urinary urgency. Pelvic pain decreased from 7/10 to 2/10. Pt's knee pain  has resolved with manual Tx and increased DF with improved eccentric control on descending stairs and thus, pt is able to return stair climbing as a form of exercise but pt has not had a chance to test out on Elliptical machine due to new medical Sx that required attention last week ( see below).  Pt's FOTO score improved for LBP from 72 pts to 94 pts which indicated improved function. Pt's spinal deviations and pelvic girdle misalignment has be restored and pt has maintained upright posture, improved deep core, lengthened pelvic floor mm,  and resolved DRA. Pt remained compliant with HEP and maintained his PT appointments despite a month long hiatus due to his medical Sx.   Pt is ready for d/c so he can attend to his other medical issues.   Interdsciplinary Update: Pt had to go to the ER due to severe stoamch pain and he found out he had gallbladder stones and will be seeing a surgeon next week. Pt has a MRI scheduled for his dizziness. HA, and floaters and will be f/u by Dr. Manuella Ghazi. Since he has taken Vitamin D for the past week as prescribed by Dr. Manuella Ghazi due to low levels, pt reports his dizziness has gone down a bit. Pt is sleeping better and will f/u with Dr. Trena Platt order for sleep study to rule in /out OSA.    Examination-Activity Limitations Squat;Bend;Sit    Stability/Clinical Decision Making Evolving/Moderate complexity    Rehab Potential Good    PT Frequency 1x / week    PT Duration Other (comment)   10   PT Treatment/Interventions Moist Heat;Therapeutic activities;Therapeutic exercise;Balance training;Neuromuscular re-education;Manual techniques;Patient/family education;Stair training;Energy conservation;Manual lymph drainage;Taping;Dry needling;Vestibular    Consulted and Agree with Plan of Care Patient           Patient will benefit from skilled therapeutic intervention in order to improve the following deficits and impairments:  Decreased strength, Decreased balance, Postural dysfunction,  Hypomobility, Impaired flexibility, Improper body mechanics, Pain, Decreased endurance, Decreased coordination, Abnormal gait, Difficulty walking, Increased muscle spasms, Decreased mobility, Impaired sensation, Decreased activity tolerance, Increased fascial restricitons, Decreased safety awareness  Visit Diagnosis: No diagnosis found.     Problem List Patient Active Problem List   Diagnosis Date Noted   Hematochezia    Polyp of sigmoid colon    Gastroesophageal reflux disease without esophagitis 11/12/2018   Glucosuria 11/12/2018   Mild persistent asthma without complication 69/62/9528    Jerl Mina ,PT, DPT, E-RYT  10/18/2019, 9:39 AM  [place] MAIN [facility] SERVICES [address] [place], [place], [address] Phone: [phone]   Fax:  [phone]  Name: Jason Petersen MRN: [id number] Date of Birth: 01-Nov-1975

## 2019-10-22 ENCOUNTER — Ambulatory Visit (INDEPENDENT_AMBULATORY_CARE_PROVIDER_SITE_OTHER): Payer: BC Managed Care – PPO | Admitting: Surgery

## 2019-10-22 ENCOUNTER — Encounter: Payer: Self-pay | Admitting: Surgery

## 2019-10-22 ENCOUNTER — Other Ambulatory Visit: Payer: Self-pay

## 2019-10-22 ENCOUNTER — Ambulatory Visit: Payer: Self-pay | Admitting: Surgery

## 2019-10-22 VITALS — BP 119/79 | HR 71 | Temp 98.3°F | Resp 12 | Ht 71.0 in | Wt 218.0 lb

## 2019-10-22 DIAGNOSIS — K8010 Calculus of gallbladder with chronic cholecystitis without obstruction: Secondary | ICD-10-CM | POA: Insufficient documentation

## 2019-10-22 NOTE — Progress Notes (Signed)
Patient ID: Jason Petersen, male   DOB: March 26, 1975, 44 y.o.   MRN: [id number]  Chief Complaint: Epigastric pain  History of Present Illness Jason Petersen is a 44 y.o. male with his first episode of epigastric pain last week, following eating a brownie.  He admits the brownie was a bit on the oily side.  He has in retrospect some intermittent right upper quadrant pain which was much milder, and presumed to be gas.  During his most recent attack that we also had associated difficulty with breathing and his pain radiated to his back.  He denies nausea and vomiting.  He reports he broke out in a cold sweat.  He did have he does have associated constipation denies diarrhea.  He reports his wife underwent gallbladder surgery and recent history.  No history of jaundice, acholic stools, melena.  Past Medical History Past Medical History:  Diagnosis Date  . Asthma   . GERD (gastroesophageal reflux disease)       Past Surgical History:  Procedure Laterality Date  . COLONOSCOPY WITH PROPOFOL N/A 05/10/2019   Procedure: COLONOSCOPY WITH PROPOFOL;  Surgeon: Lucilla Lame, MD;  Location: [facility] ENDOSCOPY;  Service: Endoscopy;  Laterality: N/A;  . ESOPHAGOGASTRODUODENOSCOPY (EGD) WITH PROPOFOL N/A 05/10/2019   Procedure: ESOPHAGOGASTRODUODENOSCOPY (EGD) WITH PROPOFOL;  Surgeon: Lucilla Lame, MD;  Location: [facility] ENDOSCOPY;  Service: Endoscopy;  Laterality: N/A;  . NASAL SINUS SURGERY    . SINUS SURGERY WITH INSTATRAK    . tubs in ears      No Known Allergies  Current Outpatient Medications  Medication Sig Dispense Refill  . beclomethasone (QVAR) 80 MCG/ACT inhaler Inhale into the lungs.    . pantoprazole (PROTONIX) 40 MG tablet Take 1 tablet (40 mg total) by mouth daily. 30 tablet 6  . sucralfate (CARAFATE) 1 GM/10ML suspension Take 10 mLs (1 g total) by mouth 4 (four) times daily. 420 mL 1  . dicyclomine (BENTYL) 10 MG capsule Take 1 capsule (10 mg total) by mouth 4 (four) times daily for 7 days. 56  capsule 0   No current facility-administered medications for this visit.    Family History Family History  Problem Relation Age of Onset  . Ovarian cancer Mother   . Brain cancer Sister       Social History Social History   Tobacco Use  . Smoking status: Never Smoker  . Smokeless tobacco: Never Used  Substance Use Topics  . Alcohol use: Yes    Comment: occasionally  . Drug use: Never        Review of Systems  Constitutional: Negative.   HENT: Positive for tinnitus.   Eyes: Negative.   Respiratory: Negative.   Cardiovascular: Negative.   Gastrointestinal: Positive for abdominal pain, constipation and heartburn (Remarkable, cervical reflux.).  Genitourinary: Negative.   Skin: Negative.   Neurological: Positive for headaches.  Psychiatric/Behavioral: Negative.       Physical Exam Blood pressure 119/79, pulse 71, temperature 98.3 F (36.8 C), temperature source Oral, resp. rate 12, height 5\' 11"  (1.803 m), weight 218 lb (98.9 kg), SpO2 97 %. Last Weight  Most recent update: 10/22/2019  9:10 AM   Weight  98.9 kg (218 lb)            CONSTITUTIONAL: Well developed, and nourished, appropriately responsive and aware without distress.   EYES: Sclera non-icteric.   EARS, NOSE, MOUTH AND THROAT: Mask worn.  Hearing is intact to voice.  NECK: Trachea is midline, and there is no jugular venous distension.  LYMPH NODES:  Lymph nodes in the neck are not enlarged. RESPIRATORY:  Lungs are clear, and breath sounds are equal bilaterally. Normal respiratory effort without pathologic use of accessory muscles. CARDIOVASCULAR: Heart is regular in rate and rhythm. GI: The abdomen is soft, nontender, and nondistended. There were no palpable masses. I did not appreciate hepatosplenomegaly. There were normal bowel sounds.  Notable also for diastases recti and a possible tiny umbilical fascial defect with associated tenderness. MUSCULOSKELETAL:  Symmetrical muscle tone appreciated in all  four extremities.    SKIN: Skin turgor is normal. No pathologic skin lesions appreciated.  NEUROLOGIC:  Motor and sensation appear grossly normal.  Cranial nerves are grossly without defect. PSYCH:  Alert and oriented to person, place and time. Affect is appropriate for situation.  Data Reviewed I have personally reviewed what is currently available of the patient's imaging, recent labs and medical records.   Labs:  CBC Latest Ref Rng & Units 10/14/2019  WBC 4.0 - 10.5 K/uL 14.7(H)  Hemoglobin 13.0 - 17.0 g/dL 15.1  Hematocrit 39 - 52 % 44.9  Platelets 150 - 400 K/uL 205   CMP Latest Ref Rng & Units 10/14/2019  Glucose 70 - 99 mg/dL 124(H)  BUN 6 - 20 mg/dL 14  Creatinine 0.61 - 1.24 mg/dL 1.02  Sodium 135 - 145 mmol/L 137  Potassium 3.5 - 5.1 mmol/L 3.9  Chloride 98 - 111 mmol/L 101  CO2 22 - 32 mmol/L 25  Calcium 8.9 - 10.3 mg/dL 9.1  Total Protein 6.5 - 8.1 g/dL 7.2  Total Bilirubin 0.3 - 1.2 mg/dL 1.1  Alkaline Phos 38 - 126 U/L 66  AST 15 - 41 U/L 20  ALT 0 - 44 U/L 36      Imaging: Radiology review:   CLINICAL DATA:  Right upper quadrant pain for 5 hours  EXAM: ULTRASOUND ABDOMEN LIMITED RIGHT UPPER QUADRANT  COMPARISON:  None.  FINDINGS: Gallbladder:  Numerous layering calculi. No wall thickening or focal tenderness. No pericholecystic edema.  Common bile duct:  Diameter: 3 mm.  Where visualized, no filling defect.  Liver:  No focal lesion identified. Within normal limits in parenchymal echogenicity. Portal vein is patent on color Doppler imaging with normal direction of blood flow towards the liver.  IMPRESSION: Numerous gallbladder calculi.  No evidence of cholecystitis.   Electronically Signed   By: Monte Fantasia M.D.   On: 10/14/2019 05:15  Within last 24 hrs: No results found.  Assessment    Chronic calculus cholecystitis Patient Active Problem List   Diagnosis Date Noted  . Hematochezia   . Polyp of sigmoid colon   .  Gastroesophageal reflux disease without esophagitis 11/12/2018  . Glucosuria 11/12/2018  . Mild persistent asthma without complication 56/31/4970    Plan    Robotic cholecystectomy.  This patient was seen and examined, and I concur with the H&P associated with this note.  This was discussed thoroughly.  Optimal plan is for robotic cholecystectomy.  Risks and benefits have been discussed with the patient which include but are not limited to anesthesia, bleeding, infection, biliary ductal injury or stenosis, other associated unanticipated injuries affiliated with laparoscopic surgery.  I believe there is the desire to proceed, interpreter utilized as needed.  Questions elicited and answered to satisfaction.  No guarantees ever expressed or implied.   Face-to-face time spent with the patient and accompanying care providers(if present) was 30 minutes, with more than 50% of the time spent counseling, educating, and coordinating care of the patient.  Ronny Bacon M.D., FACS 10/22/2019, 10:09 AM

## 2019-10-22 NOTE — Patient Instructions (Addendum)
Our surgery scheduler will call you within 24-48 hours to schedule your surgery. Please have the [facility] surgery sheet available when speaking with her.   Laparoscopic Cholecystectomy Laparoscopic cholecystectomy is surgery to remove the gallbladder. The gallbladder is a pear-shaped organ that lies beneath the liver on the right side of the body. The gallbladder stores bile, which is a fluid that helps the body to digest fats. Cholecystectomy is often done for inflammation of the gallbladder (cholecystitis). This condition is usually caused by a buildup of gallstones (cholelithiasis) in the gallbladder. Gallstones can block the flow of bile, which can result in inflammation and pain. In severe cases, emergency surgery may be required. This procedure is done though small incisions in your abdomen (laparoscopic surgery). A thin scope with a camera (laparoscope) is inserted through one incision. Thin surgical instruments are inserted through the other incisions. In some cases, a laparoscopic procedure may be turned into a type of surgery that is done through a larger incision (open surgery). Tell a health care provider about:  Any allergies you have.  All medicines you are taking, including vitamins, herbs, eye drops, creams, and over-the-counter medicines.  Any problems you or family members have had with anesthetic medicines.  Any blood disorders you have.  Any surgeries you have had.  Any medical conditions you have.  Whether you are pregnant or may be pregnant. What are the risks? Generally, this is a safe procedure. However, problems may occur, including:  Infection.  Bleeding.  Allergic reactions to medicines.  Damage to other structures or organs.  A stone remaining in the common bile duct. The common bile duct carries bile from the gallbladder into the small intestine.  A bile leak from the cyst duct that is clipped when your gallbladder is removed. What happens before the  procedure? Staying hydrated Follow instructions from your health care provider about hydration, which may include:  Up to 2 hours before the procedure - you may continue to drink clear liquids, such as water, clear fruit juice, black coffee, and plain tea. Eating and drinking restrictions Follow instructions from your health care provider about eating and drinking, which may include:  8 hours before the procedure - stop eating heavy meals or foods such as meat, fried foods, or fatty foods.  6 hours before the procedure - stop eating light meals or foods, such as toast or cereal.  6 hours before the procedure - stop drinking milk or drinks that contain milk.  2 hours before the procedure - stop drinking clear liquids. Medicines  Ask your health care provider about: ? Changing or stopping your regular medicines. This is especially important if you are taking diabetes medicines or blood thinners. ? Taking medicines such as aspirin and ibuprofen. These medicines can thin your blood. Do not take these medicines before your procedure if your health care provider instructs you not to.  You may be given antibiotic medicine to help prevent infection. General instructions  Let your health care provider know if you develop a cold or an infection before surgery.  Plan to have someone take you home from the hospital or clinic.  Ask your health care provider how your surgical site will be marked or identified. What happens during the procedure?   To reduce your risk of infection: ? Your health care team will wash or sanitize their hands. ? Your skin will be washed with soap. ? Hair may be removed from the surgical area.  An IV tube may be inserted  into one of your veins.  You will be given one or more of the following: ? A medicine to help you relax (sedative). ? A medicine to make you fall asleep (general anesthetic).  A breathing tube will be placed in your mouth.  Your surgeon will  make several small cuts (incisions) in your abdomen.  The laparoscope will be inserted through one of the small incisions. The camera on the laparoscope will send images to a TV screen (monitor) in the operating room. This lets your surgeon see inside your abdomen.  Air-like gas will be pumped into your abdomen. This will expand your abdomen to give the surgeon more room to perform the surgery.  Other tools that are needed for the procedure will be inserted through the other incisions. The gallbladder will be removed through one of the incisions.  Your common bile duct may be examined. If stones are found in the common bile duct, they may be removed.  After your gallbladder has been removed, the incisions will be closed with stitches (sutures), staples, or skin glue.  Your incisions may be covered with a bandage (dressing). The procedure may vary among health care providers and hospitals. What happens after the procedure?  Your blood pressure, heart rate, breathing rate, and blood oxygen level will be monitored until the medicines you were given have worn off.  You will be given medicines as needed to control your pain.  Do not drive for 24 hours if you were given a sedative. This information is not intended to replace advice given to you by your health care provider. Make sure you discuss any questions you have with your health care provider. Document Revised: 12/30/2016 Document Reviewed: 07/06/2015 Elsevier Patient Education  [place] If you have a gallbladder condition, you may have trouble digesting fats. Eating a low-fat diet can help reduce your symptoms, and may be helpful before and after having surgery to remove your gallbladder (cholecystectomy). Your health care provider may recommend that you work with a diet and nutrition specialist (dietitian) to help you reduce the amount of fat in your diet. What are tips for  following this plan? General guidelines  Limit your fat intake to less than 30% of your total daily calories. If you eat around 1,800 calories each day, this is less than 60 grams (g) of fat per day.  Fat is an important part of a healthy diet. Eating a low-fat diet can make it hard to maintain a healthy body weight. Ask your dietitian how much fat, calories, and other nutrients you need each day.  Eat small, frequent meals throughout the day instead of three large meals.  Drink at least 8-10 cups of fluid a day. Drink enough fluid to keep your urine clear or pale yellow.  Limit alcohol intake to no more than 1 drink a day for nonpregnant women and 2 drinks a day for men. One drink equals 12 oz of beer, 5 oz of wine, or 1 oz of hard liquor. Reading food labels  Check Nutrition Facts on food labels for the amount of fat per serving. Choose foods with less than 3 grams of fat per serving. Shopping  Choose nonfat and low-fat healthy foods. Look for the words "nonfat," "low fat," or "fat free."  Avoid buying processed or prepackaged foods. Cooking  Cook using low-fat methods, such as baking, broiling, grilling, or boiling.  Cook with small  amounts of healthy fats, such as olive oil, grapeseed oil, canola oil, or sunflower oil. What foods are recommended?   All fresh, frozen, or canned fruits and vegetables.  Whole grains.  Low-fat or non-fat (skim) milk and yogurt.  Lean meat, skinless poultry, fish, eggs, and beans.  Low-fat protein supplement powders or drinks.  Spices and herbs. What foods are not recommended?  High-fat foods. These include baked goods, fast food, fatty cuts of meat, ice cream, french toast, sweet rolls, pizza, cheese bread, foods covered with butter, creamy sauces, or cheese.  Fried foods. These include french fries, tempura, battered fish, breaded chicken, fried breads, and sweets.  Foods with strong odors.  Foods that cause bloating and  gas. Summary  A low-fat diet can be helpful if you have a gallbladder condition, or before and after gallbladder surgery.  Limit your fat intake to less than 30% of your total daily calories. This is about 60 g of fat if you eat 1,800 calories each day.  Eat small, frequent meals throughout the day instead of three large meals. This information is not intended to replace advice given to you by your health care provider. Make sure you discuss any questions you have with your health care provider. Document Revised: 05/10/2018 Document Reviewed: 02/25/2016 Elsevier Patient Education  [place].

## 2019-10-22 NOTE — H&P (View-Only) (Signed)
Patient ID: Jason Petersen, male   DOB: 11-25-1975, 44 y.o.   MRN: [id number]  Chief Complaint: Epigastric pain  History of Present Illness Jason Petersen is a 44 y.o. male with his first episode of epigastric pain last week, following eating a brownie.  He admits the brownie was a bit on the oily side.  He has in retrospect some intermittent right upper quadrant pain which was much milder, and presumed to be gas.  During his most recent attack that we also had associated difficulty with breathing and his pain radiated to his back.  He denies nausea and vomiting.  He reports he broke out in a cold sweat.  He did have he does have associated constipation denies diarrhea.  He reports his wife underwent gallbladder surgery and recent history.  No history of jaundice, acholic stools, melena.  Past Medical History Past Medical History:  Diagnosis Date  . Asthma   . GERD (gastroesophageal reflux disease)       Past Surgical History:  Procedure Laterality Date  . COLONOSCOPY WITH PROPOFOL N/A 05/10/2019   Procedure: COLONOSCOPY WITH PROPOFOL;  Surgeon: Lucilla Lame, MD;  Location: [facility] ENDOSCOPY;  Service: Endoscopy;  Laterality: N/A;  . ESOPHAGOGASTRODUODENOSCOPY (EGD) WITH PROPOFOL N/A 05/10/2019   Procedure: ESOPHAGOGASTRODUODENOSCOPY (EGD) WITH PROPOFOL;  Surgeon: Lucilla Lame, MD;  Location: [facility] ENDOSCOPY;  Service: Endoscopy;  Laterality: N/A;  . NASAL SINUS SURGERY    . SINUS SURGERY WITH INSTATRAK    . tubs in ears      No Known Allergies  Current Outpatient Medications  Medication Sig Dispense Refill  . beclomethasone (QVAR) 80 MCG/ACT inhaler Inhale into the lungs.    . pantoprazole (PROTONIX) 40 MG tablet Take 1 tablet (40 mg total) by mouth daily. 30 tablet 6  . sucralfate (CARAFATE) 1 GM/10ML suspension Take 10 mLs (1 g total) by mouth 4 (four) times daily. 420 mL 1  . dicyclomine (BENTYL) 10 MG capsule Take 1 capsule (10 mg total) by mouth 4 (four) times daily for 7 days. 56  capsule 0   No current facility-administered medications for this visit.    Family History Family History  Problem Relation Age of Onset  . Ovarian cancer Mother   . Brain cancer Sister       Social History Social History   Tobacco Use  . Smoking status: Never Smoker  . Smokeless tobacco: Never Used  Substance Use Topics  . Alcohol use: Yes    Comment: occasionally  . Drug use: Never        Review of Systems  Constitutional: Negative.   HENT: Positive for tinnitus.   Eyes: Negative.   Respiratory: Negative.   Cardiovascular: Negative.   Gastrointestinal: Positive for abdominal pain, constipation and heartburn (Remarkable, cervical reflux.).  Genitourinary: Negative.   Skin: Negative.   Neurological: Positive for headaches.  Psychiatric/Behavioral: Negative.       Physical Exam Blood pressure 119/79, pulse 71, temperature 98.3 F (36.8 C), temperature source Oral, resp. rate 12, height 5\' 11"  (1.803 m), weight 218 lb (98.9 kg), SpO2 97 %. Last Weight  Most recent update: 10/22/2019  9:10 AM   Weight  98.9 kg (218 lb)            CONSTITUTIONAL: Well developed, and nourished, appropriately responsive and aware without distress.   EYES: Sclera non-icteric.   EARS, NOSE, MOUTH AND THROAT: Mask worn.  Hearing is intact to voice.  NECK: Trachea is midline, and there is no jugular venous distension.  LYMPH NODES:  Lymph nodes in the neck are not enlarged. RESPIRATORY:  Lungs are clear, and breath sounds are equal bilaterally. Normal respiratory effort without pathologic use of accessory muscles. CARDIOVASCULAR: Heart is regular in rate and rhythm. GI: The abdomen is soft, nontender, and nondistended. There were no palpable masses. I did not appreciate hepatosplenomegaly. There were normal bowel sounds.  Notable also for diastases recti and a possible tiny umbilical fascial defect with associated tenderness. MUSCULOSKELETAL:  Symmetrical muscle tone appreciated in all  four extremities.    SKIN: Skin turgor is normal. No pathologic skin lesions appreciated.  NEUROLOGIC:  Motor and sensation appear grossly normal.  Cranial nerves are grossly without defect. PSYCH:  Alert and oriented to person, place and time. Affect is appropriate for situation.  Data Reviewed I have personally reviewed what is currently available of the patient's imaging, recent labs and medical records.   Labs:  CBC Latest Ref Rng & Units 10/14/2019  WBC 4.0 - 10.5 K/uL 14.7(H)  Hemoglobin 13.0 - 17.0 g/dL 15.1  Hematocrit 39 - 52 % 44.9  Platelets 150 - 400 K/uL 205   CMP Latest Ref Rng & Units 10/14/2019  Glucose 70 - 99 mg/dL 124(H)  BUN 6 - 20 mg/dL 14  Creatinine 0.61 - 1.24 mg/dL 1.02  Sodium 135 - 145 mmol/L 137  Potassium 3.5 - 5.1 mmol/L 3.9  Chloride 98 - 111 mmol/L 101  CO2 22 - 32 mmol/L 25  Calcium 8.9 - 10.3 mg/dL 9.1  Total Protein 6.5 - 8.1 g/dL 7.2  Total Bilirubin 0.3 - 1.2 mg/dL 1.1  Alkaline Phos 38 - 126 U/L 66  AST 15 - 41 U/L 20  ALT 0 - 44 U/L 36      Imaging: Radiology review:   CLINICAL DATA:  Right upper quadrant pain for 5 hours  EXAM: ULTRASOUND ABDOMEN LIMITED RIGHT UPPER QUADRANT  COMPARISON:  None.  FINDINGS: Gallbladder:  Numerous layering calculi. No wall thickening or focal tenderness. No pericholecystic edema.  Common bile duct:  Diameter: 3 mm.  Where visualized, no filling defect.  Liver:  No focal lesion identified. Within normal limits in parenchymal echogenicity. Portal vein is patent on color Doppler imaging with normal direction of blood flow towards the liver.  IMPRESSION: Numerous gallbladder calculi.  No evidence of cholecystitis.   Electronically Signed   By: Monte Fantasia M.D.   On: 10/14/2019 05:15  Within last 24 hrs: No results found.  Assessment    Chronic calculus cholecystitis Patient Active Problem List   Diagnosis Date Noted  . Hematochezia   . Polyp of sigmoid colon   .  Gastroesophageal reflux disease without esophagitis 11/12/2018  . Glucosuria 11/12/2018  . Mild persistent asthma without complication 07/27/9483    Plan    Robotic cholecystectomy.  This patient was seen and examined, and I concur with the H&P associated with this note.  This was discussed thoroughly.  Optimal plan is for robotic cholecystectomy.  Risks and benefits have been discussed with the patient which include but are not limited to anesthesia, bleeding, infection, biliary ductal injury or stenosis, other associated unanticipated injuries affiliated with laparoscopic surgery.  I believe there is the desire to proceed, interpreter utilized as needed.  Questions elicited and answered to satisfaction.  No guarantees ever expressed or implied.   Face-to-face time spent with the patient and accompanying care providers(if present) was 30 minutes, with more than 50% of the time spent counseling, educating, and coordinating care of the patient.  Ronny Bacon M.D., FACS 10/22/2019, 10:09 AM

## 2019-10-23 ENCOUNTER — Ambulatory Visit
Admission: RE | Admit: 2019-10-23 | Discharge: 2019-10-23 | Disposition: A | Discharge status: Home / Self Care | Payer: BC Managed Care – PPO | Source: Ambulatory Visit | Attending: Neurology | Admitting: Neurology

## 2019-10-23 ENCOUNTER — Telehealth: Payer: Self-pay | Admitting: Surgery

## 2019-10-23 ENCOUNTER — Other Ambulatory Visit: Payer: Self-pay

## 2019-10-23 ENCOUNTER — Encounter: Payer: BC Managed Care – PPO | Admitting: Physical Therapy

## 2019-10-23 DIAGNOSIS — M542 Cervicalgia: Secondary | ICD-10-CM | POA: Diagnosis present

## 2019-10-23 DIAGNOSIS — M5481 Occipital neuralgia: Secondary | ICD-10-CM | POA: Insufficient documentation

## 2019-10-23 NOTE — Telephone Encounter (Signed)
Patient has been advised of Pre-Admission date/time, COVID Testing date and Surgery date.  Surgery Date: 11/06/19 Preadmission Testing Date: 10/30/19 (phone 8a-1p) Covid Testing Date: 11/04/19 - patient advised to go to the [facility] ([place]) between 8a-1p  Patient has been made aware to call [phone], between 1-3:00pm the day before surgery, to find out what time to arrive for surgery.

## 2019-10-25 ENCOUNTER — Ambulatory Visit: Payer: BC Managed Care – PPO | Admitting: Physical Therapy

## 2019-10-30 ENCOUNTER — Encounter
Admission: RE | Admit: 2019-10-30 | Discharge: 2019-10-30 | Disposition: A | Discharge status: Home / Self Care | Payer: BC Managed Care – PPO | Source: Ambulatory Visit | Attending: Surgery | Admitting: Surgery

## 2019-10-30 ENCOUNTER — Other Ambulatory Visit: Payer: Self-pay

## 2019-10-30 DIAGNOSIS — Z01812 Encounter for preprocedural laboratory examination: Secondary | ICD-10-CM | POA: Diagnosis not present

## 2019-10-30 NOTE — Patient Instructions (Addendum)
Your procedure is scheduled on: 11-04-2019 Report to Day Surgery on the 2nd floor of the [facility]. To find out your arrival time, please call [phone] between 1PM - 3PM on: 11-03-2019  REMEMBER: Instructions that are not followed completely may result in serious medical risk, up to and including death; or upon the discretion of your surgeon and anesthesiologist your surgery may need to be rescheduled.  Do not eat food after midnight the night before surgery.  No gum chewing, lozengers or hard candies.  You may however, drink CLEAR liquids up to 2 hours before you are scheduled to arrive for your surgery. Do not drink anything within 2 hours of your scheduled arrival time.  Clear liquids include: - water  - apple juice without pulp - gatorade (not RED) - black coffee or tea (Do NOT add milk or creamers to the coffee or tea) Do NOT drink anything that is not on this list.       TAKE THESE MEDICATIONS THE MORNING OF SURGERY WITH A SIP OF WATER:  - NASONEX 50 MCG  - PROTONIX 40 MG , take one the night before and one on the morning of surgery - helps to prevent nausea after surgery.)  Use QVAR  Inhaler AS DIRECTED on the day of surgery and bring to the hospital.  .    One week prior to surgery: Stop Anti-inflammatories (NSAIDS) such as Advil, Aleve, Ibuprofen, Motrin, Naproxen, Naprosyn and Aspirin based products such as Excedrin, Goodys Powder, BC Powder. Stop ANY OVER THE COUNTER supplements until after surgery. (You may continue taking Tylenol, Vitamin D, Vitamin B, and multivitamin.)  No Alcohol for 24 hours before or after surgery.  No Smoking including e-cigarettes for 24 hours prior to surgery.  No chewable tobacco products for at least 6 hours prior to surgery.  No nicotine patches on the day of surgery.  Do not use any "recreational" drugs for at least a week prior to your surgery.  Please be advised that the combination of cocaine and anesthesia may have  negative outcomes, up to and including death. If you test positive for cocaine, your surgery will be cancelled.  On the morning of surgery brush your teeth with toothpaste and water, you may rinse your mouth with mouthwash if you wish. Do not swallow any toothpaste or mouthwash.  Do not wear jewelry, make-up, hairpins, clips or nail polish.  Do not wear lotions, powders, or perfumes.   Do not shave 48 hours prior to surgery.   Contact lenses, hearing aids and dentures may not be worn into surgery.  Do not bring valuables to the hospital. [facility] is not responsible for any missing/lost belongings or valuables.   Use CHG Soap or wipes as directed on instruction sheet.  Total Shoulder Arthroplasty:  use Benzolyl Peroxide 5% Gel as directed on instruction sheet.  Fleets enema or Magnesium Citrate as directed.  Bring your C-PAP to the hospital with you in case you may have to spend the night.   Notify your doctor if there is any change in your medical condition (cold, fever, infection).  Wear comfortable clothing (specific to your surgery type) to the hospital.  Plan for stool softeners for home use; pain medications have a tendency to cause constipation. You can also help prevent constipation by eating foods high in fiber such as fruits and vegetables and drinking plenty of fluids as your diet allows.  After surgery, you can help prevent lung complications by doing breathing exercises.  Take deep breaths and cough every 1-2 hours. Your doctor may order a device called an Incentive Spirometer to help you take deep breaths. When coughing or sneezing, hold a pillow firmly against your incision with both hands. This is called "splinting." Doing this helps protect your incision. It also decreases belly discomfort.  If you are being admitted to the hospital overnight, leave your suitcase in the car. After surgery it may be brought to your room.  If you are being discharged the day of  surgery, you will not be allowed to drive home. You will need a responsible adult (18 years or older) to drive you home and stay with you that night.   If you are taking public transportation, you will need to have a responsible adult (18 years or older) with you. Please confirm with your physician that it is acceptable to use public transportation.   Please call the [facility] at [phone] if you have any questions about these instructions.  Visitation Policy:  Patients undergoing a surgery or procedure may have one family member or support person with them as long as that person is not COVID-19 positive or experiencing its symptoms.  That person may remain in the waiting area during the procedure.  Inpatient Visitation Update:   In an effort to ensure the safety of our team members and our patients, we are implementing a change to our visitation policy:  Effective Monday, Aug. 9, at 7 a.m., inpatients will be allowed one support person.  o The support person may change daily.  o The support person must pass our screening, gel in and out, and wear a mask at all times, including in the patient's room.

## 2019-10-31 ENCOUNTER — Encounter: Payer: BC Managed Care – PPO | Admitting: Physical Therapy

## 2019-11-04 ENCOUNTER — Other Ambulatory Visit
Admission: RE | Admit: 2019-11-04 | Discharge: 2019-11-04 | Disposition: A | Discharge status: Home / Self Care | Payer: BC Managed Care – PPO | Source: Ambulatory Visit | Attending: Surgery | Admitting: Surgery

## 2019-11-04 ENCOUNTER — Other Ambulatory Visit: Payer: Self-pay

## 2019-11-04 DIAGNOSIS — Z01812 Encounter for preprocedural laboratory examination: Secondary | ICD-10-CM | POA: Diagnosis present

## 2019-11-04 DIAGNOSIS — Z20822 Contact with and (suspected) exposure to covid-19: Secondary | ICD-10-CM | POA: Diagnosis not present

## 2019-11-04 LAB — SARS CORONAVIRUS 2 (TAT 6-24 HRS): SARS Coronavirus 2: NEGATIVE

## 2019-11-06 ENCOUNTER — Other Ambulatory Visit: Payer: Self-pay

## 2019-11-06 ENCOUNTER — Encounter: Payer: Self-pay | Admitting: Surgery

## 2019-11-06 ENCOUNTER — Ambulatory Visit
Admission: RE | Admit: 2019-11-06 | Discharge: 2019-11-06 | Disposition: A | Discharge status: Home / Self Care | Payer: BC Managed Care – PPO | Attending: Surgery | Admitting: Surgery

## 2019-11-06 ENCOUNTER — Ambulatory Visit: Payer: BC Managed Care – PPO | Admitting: Anesthesiology

## 2019-11-06 ENCOUNTER — Encounter
Admission: RE | Disposition: A | Discharge status: Home / Self Care | Payer: Self-pay | Source: Home / Self Care | Attending: Surgery

## 2019-11-06 DIAGNOSIS — Z79899 Other long term (current) drug therapy: Secondary | ICD-10-CM | POA: Diagnosis not present

## 2019-11-06 DIAGNOSIS — Z7951 Long term (current) use of inhaled steroids: Secondary | ICD-10-CM | POA: Diagnosis not present

## 2019-11-06 DIAGNOSIS — J4530 Mild persistent asthma, uncomplicated: Secondary | ICD-10-CM | POA: Diagnosis not present

## 2019-11-06 DIAGNOSIS — K8010 Calculus of gallbladder with chronic cholecystitis without obstruction: Secondary | ICD-10-CM | POA: Insufficient documentation

## 2019-11-06 DIAGNOSIS — K219 Gastro-esophageal reflux disease without esophagitis: Secondary | ICD-10-CM | POA: Insufficient documentation

## 2019-11-06 DIAGNOSIS — G4730 Sleep apnea, unspecified: Secondary | ICD-10-CM | POA: Diagnosis not present

## 2019-11-06 SURGERY — CHOLECYSTECTOMY, ROBOT-ASSISTED, LAPAROSCOPIC
Anesthesia: General | Site: Abdomen

## 2019-11-06 MED ORDER — EPHEDRINE 5 MG/ML INJ
INTRAVENOUS | Status: AC
  Filled 2019-11-06: qty 10

## 2019-11-06 MED ORDER — ACETAMINOPHEN 500 MG PO TABS
ORAL_TABLET | ORAL | Status: AC
  Administered 2019-11-06: 1000 mg via ORAL
  Filled 2019-11-06: qty 2

## 2019-11-06 MED ORDER — DEXMEDETOMIDINE (PRECEDEX) IN NS 20 MCG/5ML (4 MCG/ML) IV SYRINGE
PREFILLED_SYRINGE | INTRAVENOUS | Status: AC
  Filled 2019-11-06: qty 5

## 2019-11-06 MED ORDER — OXYCODONE HCL 5 MG/5ML PO SOLN: 5.0000 mg | Freq: Once | ORAL | Status: DC | PRN

## 2019-11-06 MED ORDER — FENTANYL CITRATE (PF) 100 MCG/2ML IJ SOLN
INTRAMUSCULAR | Status: DC | PRN
  Administered 2019-11-06 (×2): 50 ug via INTRAVENOUS

## 2019-11-06 MED ORDER — CELECOXIB 200 MG PO CAPS
ORAL_CAPSULE | ORAL | Status: AC
  Administered 2019-11-06: 200 mg via ORAL
  Filled 2019-11-06: qty 1

## 2019-11-06 MED ORDER — INDOCYANINE GREEN 25 MG IV SOLR
1.2500 mg | Freq: Once | INTRAVENOUS | Status: AC
  Administered 2019-11-06: 1.25 mg via INTRAVENOUS
  Filled 2019-11-06: qty 0.5

## 2019-11-06 MED ORDER — BUPIVACAINE-EPINEPHRINE (PF) 0.25% -1:200000 IJ SOLN
INTRAMUSCULAR | Status: DC | PRN
  Administered 2019-11-06: 30 mL via PERINEURAL

## 2019-11-06 MED ORDER — PROPOFOL 10 MG/ML IV BOLUS
INTRAVENOUS | Status: DC | PRN
  Administered 2019-11-06: 170 mg via INTRAVENOUS

## 2019-11-06 MED ORDER — MIDAZOLAM HCL 2 MG/2ML IJ SOLN
INTRAMUSCULAR | Status: DC | PRN
  Administered 2019-11-06: 2 mg via INTRAVENOUS

## 2019-11-06 MED ORDER — ROCURONIUM BROMIDE 100 MG/10ML IV SOLN
INTRAVENOUS | Status: DC | PRN
  Administered 2019-11-06: 50 mg via INTRAVENOUS
  Administered 2019-11-06: 30 mg via INTRAVENOUS

## 2019-11-06 MED ORDER — ONDANSETRON HCL 4 MG/2ML IJ SOLN
INTRAMUSCULAR | Status: DC | PRN
  Administered 2019-11-06: 4 mg via INTRAVENOUS

## 2019-11-06 MED ORDER — FENTANYL CITRATE (PF) 100 MCG/2ML IJ SOLN
25.0000 ug | INTRAMUSCULAR | Status: DC | PRN
  Administered 2019-11-06 (×3): 25 ug via INTRAVENOUS

## 2019-11-06 MED ORDER — DEXAMETHASONE SODIUM PHOSPHATE 10 MG/ML IJ SOLN
INTRAMUSCULAR | Status: DC | PRN
  Administered 2019-11-06: 10 mg via INTRAVENOUS

## 2019-11-06 MED ORDER — CEFAZOLIN SODIUM-DEXTROSE 2-4 GM/100ML-% IV SOLN
2.0000 g | INTRAVENOUS | Status: AC
  Administered 2019-11-06: 2 g via INTRAVENOUS

## 2019-11-06 MED ORDER — ONDANSETRON HCL 4 MG/2ML IJ SOLN
INTRAMUSCULAR | Status: AC
  Filled 2019-11-06: qty 2

## 2019-11-06 MED ORDER — CELECOXIB 200 MG PO CAPS: 200.0000 mg | ORAL_CAPSULE | ORAL | Status: AC

## 2019-11-06 MED ORDER — FENTANYL CITRATE (PF) 100 MCG/2ML IJ SOLN
INTRAMUSCULAR | Status: AC
  Administered 2019-11-06: 25 ug via INTRAVENOUS
  Filled 2019-11-06: qty 2

## 2019-11-06 MED ORDER — ORAL CARE MOUTH RINSE: 15.0000 mL | Freq: Once | OROMUCOSAL | Status: AC

## 2019-11-06 MED ORDER — CEFAZOLIN SODIUM-DEXTROSE 2-4 GM/100ML-% IV SOLN
INTRAVENOUS | Status: AC
  Filled 2019-11-06: qty 100

## 2019-11-06 MED ORDER — SUGAMMADEX SODIUM 200 MG/2ML IV SOLN
INTRAVENOUS | Status: DC | PRN
  Administered 2019-11-06: 200 mg via INTRAVENOUS

## 2019-11-06 MED ORDER — CHLORHEXIDINE GLUCONATE CLOTH 2 % EX PADS: 6.0000 | MEDICATED_PAD | Freq: Once | CUTANEOUS | Status: DC

## 2019-11-06 MED ORDER — BUPIVACAINE LIPOSOME 1.3 % IJ SUSP: 20.0000 mL | Freq: Once | INTRAMUSCULAR | Status: DC

## 2019-11-06 MED ORDER — ROCURONIUM BROMIDE 10 MG/ML (PF) SYRINGE
PREFILLED_SYRINGE | INTRAVENOUS | Status: AC
  Filled 2019-11-06: qty 10

## 2019-11-06 MED ORDER — DEXMEDETOMIDINE (PRECEDEX) IN NS 20 MCG/5ML (4 MCG/ML) IV SYRINGE
PREFILLED_SYRINGE | INTRAVENOUS | Status: DC | PRN
  Administered 2019-11-06 (×3): 4 ug via INTRAVENOUS

## 2019-11-06 MED ORDER — CHLORHEXIDINE GLUCONATE 0.12 % MT SOLN: 15.0000 mL | Freq: Once | OROMUCOSAL | Status: AC

## 2019-11-06 MED ORDER — OXYCODONE HCL 5 MG PO TABS: 5.0000 mg | ORAL_TABLET | Freq: Once | ORAL | Status: DC | PRN

## 2019-11-06 MED ORDER — CHLORHEXIDINE GLUCONATE 0.12 % MT SOLN
OROMUCOSAL | Status: AC
  Administered 2019-11-06: 15 mL via OROMUCOSAL
  Filled 2019-11-06: qty 15

## 2019-11-06 MED ORDER — LACTATED RINGERS IV SOLN: INTRAVENOUS | Status: DC

## 2019-11-06 MED ORDER — HYDROCODONE-ACETAMINOPHEN 5-325 MG PO TABS: 1.0000 | ORAL_TABLET | Freq: Four times a day (QID) | ORAL | 0 refills | Status: DC | PRN

## 2019-11-06 MED ORDER — BUPIVACAINE LIPOSOME 1.3 % IJ SUSP
INTRAMUSCULAR | Status: AC
  Filled 2019-11-06: qty 20

## 2019-11-06 MED ORDER — GABAPENTIN 300 MG PO CAPS: 300.0000 mg | ORAL_CAPSULE | ORAL | Status: AC

## 2019-11-06 MED ORDER — GABAPENTIN 300 MG PO CAPS
ORAL_CAPSULE | ORAL | Status: AC
  Administered 2019-11-06: 300 mg via ORAL
  Filled 2019-11-06: qty 1

## 2019-11-06 MED ORDER — IBUPROFEN 800 MG PO TABS: 800.0000 mg | ORAL_TABLET | Freq: Three times a day (TID) | ORAL | 0 refills | Status: DC | PRN

## 2019-11-06 MED ORDER — LIDOCAINE HCL (CARDIAC) PF 100 MG/5ML IV SOSY
PREFILLED_SYRINGE | INTRAVENOUS | Status: DC | PRN
  Administered 2019-11-06: 100 mg via INTRAVENOUS

## 2019-11-06 MED ORDER — BUPIVACAINE-EPINEPHRINE (PF) 0.25% -1:200000 IJ SOLN
INTRAMUSCULAR | Status: AC
  Filled 2019-11-06: qty 30

## 2019-11-06 MED ORDER — DEXAMETHASONE SODIUM PHOSPHATE 10 MG/ML IJ SOLN
INTRAMUSCULAR | Status: AC
  Filled 2019-11-06: qty 1

## 2019-11-06 MED ORDER — EPHEDRINE SULFATE 50 MG/ML IJ SOLN
INTRAMUSCULAR | Status: DC | PRN
  Administered 2019-11-06 (×2): 10 mg via INTRAVENOUS

## 2019-11-06 MED ORDER — ACETAMINOPHEN 500 MG PO TABS: 1000.0000 mg | ORAL_TABLET | ORAL | Status: AC

## 2019-11-06 SURGICAL SUPPLY — 50 items
"PENCIL ELECTRO HAND CTR " (MISCELLANEOUS) ×1 IMPLANT
ADH SKN CLS APL DERMABOND .7 (GAUZE/BANDAGES/DRESSINGS) ×1
APL PRP STRL LF DISP 70% ISPRP (MISCELLANEOUS) ×1
BAG SPEC RTRVL LRG 6X4 10 (ENDOMECHANICALS) ×1
BLADE CLIPPER SURG (BLADE) ×2 IMPLANT
CANISTER SUCT 1200ML W/VALVE (MISCELLANEOUS) ×3 IMPLANT
CHLORAPREP W/TINT 26 (MISCELLANEOUS) ×3 IMPLANT
CLIP VESOLOCK MED LG 6/CT (CLIP) ×3 IMPLANT
COVER TIP SHEARS 8 DVNC (MISCELLANEOUS) ×1 IMPLANT
COVER TIP SHEARS 8MM DA VINCI (MISCELLANEOUS) ×2
COVER WAND RF STERILE (DRAPES) ×3 IMPLANT
DECANTER SPIKE VIAL GLASS SM (MISCELLANEOUS) ×1 IMPLANT
DEFOGGER SCOPE WARMER CLEARIFY (MISCELLANEOUS) ×3 IMPLANT
DERMABOND ADVANCED (GAUZE/BANDAGES/DRESSINGS) ×2
DERMABOND ADVANCED .7 DNX12 (GAUZE/BANDAGES/DRESSINGS) ×1 IMPLANT
DRAPE ARM DVNC X/XI (DISPOSABLE) ×4 IMPLANT
DRAPE COLUMN DVNC XI (DISPOSABLE) ×1 IMPLANT
DRAPE DA VINCI XI ARM (DISPOSABLE) ×8
DRAPE DA VINCI XI COLUMN (DISPOSABLE) ×2
ELECT CAUTERY BLADE 6.4 (BLADE) ×3 IMPLANT
GLOVE ORTHO TXT STRL SZ7.5 (GLOVE) ×6 IMPLANT
GOWN STRL REUS W/ TWL LRG LVL3 (GOWN DISPOSABLE) ×4 IMPLANT
GOWN STRL REUS W/TWL LRG LVL3 (GOWN DISPOSABLE) ×12
GRASPER SUT TROCAR 14GX15 (MISCELLANEOUS) IMPLANT
INFUSOR MANOMETER BAG 3000ML (MISCELLANEOUS) IMPLANT
IRRIGATION STRYKERFLOW (MISCELLANEOUS) IMPLANT
IRRIGATOR STRYKERFLOW (MISCELLANEOUS)
IRRIGATOR SUCT 8 DISP DVNC XI (IRRIGATION / IRRIGATOR) IMPLANT
IRRIGATOR SUCTION 8MM XI DISP (IRRIGATION / IRRIGATOR)
IV NS IRRIG 3000ML ARTHROMATIC (IV SOLUTION) IMPLANT
KIT PINK PAD W/HEAD ARE REST (MISCELLANEOUS) ×3
KIT PINK PAD W/HEAD ARM REST (MISCELLANEOUS) ×1 IMPLANT
KIT TURNOVER KIT A (KITS) ×3 IMPLANT
LABEL OR SOLS (LABEL) ×3 IMPLANT
NDL INSUFFLATION 14GA 120MM (NEEDLE) IMPLANT
NEEDLE HYPO 22GX1.5 SAFETY (NEEDLE) ×3 IMPLANT
NEEDLE INSUFFLATION 14GA 120MM (NEEDLE) IMPLANT
NS IRRIG 500ML POUR BTL (IV SOLUTION) ×3 IMPLANT
PACK LAP CHOLECYSTECTOMY (MISCELLANEOUS) ×3 IMPLANT
PENCIL ELECTRO HAND CTR (MISCELLANEOUS) ×3 IMPLANT
POUCH SPECIMEN RETRIEVAL 10MM (ENDOMECHANICALS) ×3 IMPLANT
SEAL CANN UNIV 5-8 DVNC XI (MISCELLANEOUS) ×4 IMPLANT
SEAL XI 5MM-8MM UNIVERSAL (MISCELLANEOUS) ×8
SET TUBE SMOKE EVAC HIGH FLOW (TUBING) ×3 IMPLANT
SOLUTION ELECTROLUBE (MISCELLANEOUS) ×3 IMPLANT
SUT MNCRL 4-0 (SUTURE) ×3
SUT MNCRL 4-0 27XMFL (SUTURE) ×1
SUT VICRYL 0 AB UR-6 (SUTURE) ×3 IMPLANT
SUTURE MNCRL 4-0 27XMF (SUTURE) ×1 IMPLANT
TROCAR Z-THREAD FIOS 11X100 BL (TROCAR) ×3 IMPLANT

## 2019-11-06 NOTE — Interval H&P Note (Signed)
History and Physical Interval Note:  11/06/2019 1:33 PM  Jason Petersen  has presented today for surgery, with the diagnosis of Chronic calculous cholecystitis.  The various methods of treatment have been discussed with the patient and family. After consideration of risks, benefits and other options for treatment, the patient has consented to  Procedure(s): XI ROBOTIC [place] (N/A) as a surgical intervention.  The patient's history has been reviewed, patient examined, no change in status, stable for surgery.  I have reviewed the patient's chart and labs.  Questions were answered to the patient's satisfaction.     Ronny Bacon

## 2019-11-06 NOTE — Anesthesia Preprocedure Evaluation (Signed)
Anesthesia Evaluation  Patient identified by MRN, date of birth, ID band Patient awake    Reviewed: Allergy & Precautions, H&P , NPO status , Patient's Chart, lab work & pertinent test results  History of Anesthesia Complications Negative for: history of anesthetic complications  Airway Mallampati: III  TM Distance: <3 FB Neck ROM: full    Dental  (+) Chipped   Pulmonary neg shortness of breath, asthma , sleep apnea ,    Pulmonary exam normal        Cardiovascular Exercise Tolerance: Good (-) angina(-) Past MI negative cardio ROS Normal cardiovascular exam     Neuro/Psych negative neurological ROS  negative psych ROS   GI/Hepatic Neg liver ROS, GERD  Medicated and Controlled,  Endo/Other  negative endocrine ROS  Renal/GU      Musculoskeletal   Abdominal   Peds  Hematology negative hematology ROS (+)   Anesthesia Other Findings Past Medical History: No date: Asthma No date: GERD (gastroesophageal reflux disease)  Past Surgical History: 05/10/2019: COLONOSCOPY WITH PROPOFOL; N/A     Comment:  Procedure: COLONOSCOPY WITH PROPOFOL;  Surgeon: Lucilla Lame, MD;  Location: [facility] ENDOSCOPY;  Service:               Endoscopy;  Laterality: N/A; 05/10/2019: ESOPHAGOGASTRODUODENOSCOPY (EGD) WITH PROPOFOL; N/A     Comment:  Procedure: ESOPHAGOGASTRODUODENOSCOPY (EGD) WITH               PROPOFOL;  Surgeon: Lucilla Lame, MD;  Location: [facility]               ENDOSCOPY;  Service: Endoscopy;  Laterality: N/A; No date: NASAL SINUS SURGERY No date: SINUS SURGERY WITH INSTATRAK No date: tubs in ears  BMI    Body Mass Index: 30.44 kg/m      Reproductive/Obstetrics negative OB ROS                             Anesthesia Physical Anesthesia Plan  ASA: III  Anesthesia Plan: General ETT   Post-op Pain Management:    Induction: Intravenous  PONV Risk Score and Plan: Ondansetron,  Dexamethasone, Midazolam and Treatment may vary due to age or medical condition  Airway Management Planned: Oral ETT  Additional Equipment:   Intra-op Plan:   Post-operative Plan: Extubation in OR  Informed Consent: I have reviewed the patients History and Physical, chart, labs and discussed the procedure including the risks, benefits and alternatives for the proposed anesthesia with the patient or authorized representative who has indicated his/her understanding and acceptance.     Dental Advisory Given  Plan Discussed with: Anesthesiologist, CRNA and Surgeon  Anesthesia Plan Comments: (Patient consented for risks of anesthesia including but not limited to:  - adverse reactions to medications - damage to eyes, teeth, lips or other oral mucosa - nerve damage due to positioning  - sore throat or hoarseness - Damage to heart, brain, nerves, lungs, other parts of body or loss of life  Patient voiced understanding.)        Anesthesia Quick Evaluation

## 2019-11-06 NOTE — Discharge Instructions (Signed)

## 2019-11-06 NOTE — Transfer of Care (Signed)
Immediate Anesthesia Transfer of Care Note  Patient: Jason Petersen  Procedure(s) Performed: XI ROBOTIC ASSISTED LAPAROSCOPIC CHOLECYSTECTOMY (N/A Abdomen)  Patient Location: PACU  Anesthesia Type:General  Level of Consciousness: awake, oriented, drowsy and patient cooperative  Airway & Oxygen Therapy: Patient Spontanous Breathing and Patient connected to face mask oxygen  Post-op Assessment: Report given to RN and Post -op Vital signs reviewed and stable  Post vital signs: Reviewed and stable  Last Vitals:  Vitals Value Taken Time  BP 149/96 11/06/19 1553  Temp 36.7 C 11/06/19 1553  Pulse 88 11/06/19 1558  Resp 14 11/06/19 1558  SpO2 99 % 11/06/19 1558  Vitals shown include unvalidated device data.  Last Pain:  Vitals:   11/06/19 1553  TempSrc:   PainSc: Asleep         Complications: No complications documented.

## 2019-11-06 NOTE — Anesthesia Postprocedure Evaluation (Signed)
Anesthesia Post Note  Patient: Jason Petersen  Procedure(s) Performed: XI ROBOTIC ASSISTED LAPAROSCOPIC CHOLECYSTECTOMY (N/A Abdomen)  Patient location during evaluation: PACU Anesthesia Type: General Level of consciousness: awake and alert Pain management: pain level controlled Vital Signs Assessment: post-procedure vital signs reviewed and stable Respiratory status: spontaneous breathing, nonlabored ventilation, respiratory function stable and patient connected to nasal cannula oxygen Cardiovascular status: blood pressure returned to baseline and stable Postop Assessment: no apparent nausea or vomiting Anesthetic complications: no   No complications documented.   Last Vitals:  Vitals:   11/06/19 1649 11/06/19 1713  BP: 123/81 134/77  Pulse: 95 93  Resp: 10 16  Temp:  37.3 C  SpO2: 95% 95%    Last Pain:  Vitals:   11/06/19 1713  TempSrc: Temporal  PainSc: 3                  Martha Clan

## 2019-11-06 NOTE — Anesthesia Procedure Notes (Addendum)
Procedure Name: Intubation Date/Time: 11/06/2019 2:38 PM Performed by: Ruffin Pyo, RN Pre-anesthesia Checklist: Patient identified, Emergency Drugs available, Suction available and Patient being monitored Patient Re-evaluated:Patient Re-evaluated prior to induction Oxygen Delivery Method: Circle system utilized Preoxygenation: Pre-oxygenation with 100% oxygen Induction Type: IV induction Ventilation: Mask ventilation without difficulty Laryngoscope Size: McGraph and 3 Grade View: Grade I Tube type: Oral Tube size: 7.5 mm Number of attempts: 2 Airway Equipment and Method: Stylet,  Oral airway and Video-laryngoscopy Placement Confirmation: ETT inserted through vocal cords under direct vision,  positive ETCO2 and breath sounds checked- equal and bilateral Secured at: 21 cm Tube secured with: Tape Dental Injury: Teeth and Oropharynx as per pre-operative assessment  Difficulty Due To: Difficult Airway- due to anterior larynx and Difficult Airway- due to limited oral opening

## 2019-11-06 NOTE — Op Note (Signed)
Robotic cholecystectomy  Pre-operative Diagnosis: Chronic calculus cholecystitis  Post-operative Diagnosis:  Same.  Procedure: Robotic assisted laparoscopic cholecystectomy.  Surgeon: Ronny Bacon, M.D., FACS  Anesthesia: General. with endotracheal tube  Findings: Minimal adhesions  Estimated Blood Loss: 10 mL         Drains: None         Specimens: Gallbladder           Complications: none  Procedure Details  The patient was seen again in the Holding Room.  1.25-2.5 mg dose of ICG was administered intravenously.  The benefits, complications, treatment options, risks and expected outcomes were discussed with the patient. The likelihood of improving the patient's symptoms with return to their baseline status is good.  The patient and/or family concurred with the proposed plan, giving informed consent, again alternatives reviewed.  The patient was taken to Operating Room, identified, and the procedure verified as robotic assisted laparoscopic cholecystectomy.  Prior to the induction of general anesthesia, antibiotic prophylaxis was administered. VTE prophylaxis was in place. General endotracheal anesthesia was then administered and tolerated well. The patient was positioned in the supine position.  After the induction, the abdomen was prepped with Chloraprep and draped in the sterile fashion.  A Time Out was held and the above information confirmed.  Right infra-umbilical local infiltration with quarter percent Marcaine with epinephrine is utilized.  Made a 12 mm incision on the left periumbilical site, I advanced an optical 16mm port under direct visualization into the peritoneal cavity.  Once the peritoneum was penetrated, insufflation was transferred.  The trocar was then advanced into the abdominal cavity under direct visualization. Pneumoperitoneum was then continued with CO2 at 14 mmHg or less and tolerated well without any adverse changes in the patient's vital signs.  Two 8.5-mm  ports were placed in the left lower quadrant and laterally, and one to the right lower quadrant, all under direct vision. All skin incisions  were infiltrated with a local anesthetic agent before making the incision and placing the trocars.  The patient was positioned  in reverse Trendelenburg, tilted the patient's left side down.  Da Vinci XI robot was then positioned on to the patient's left side, and docked.  The gallbladder was identified, the fundus grasped via the arm 4 Prograsp and retracted cephalad. Adhesions were lysed with scissors and cautery.  The infundibulum was identified grasped and retracted laterally, exposing the peritoneum overlying the triangle of Calot. This was then opened and dissected using cautery & scissors. An extended critical view of the cystic duct and cystic artery was obtained, aided by the ICG via FireFly which enabled ready visualization of the ductal anatomy.    The cystic duct was clearly identified and dissected to isolation.   Artery well isolated and clipped, and the cystic duct was triple clipped and divided with scissors, leaving two on the remaining stump.  The cystic artery is sealed with bipolar and divided with monopolar scissors.   The gallbladder was taken from the gallbladder fossa in a retrograde fashion with the electrocautery. The gallbladder was removed and placed in an Endocatch bag.  The liver bed is inspected. Hemostasis was confirmed.  The robot was undocked and moved away from the operative field. The gallbladder and Endocatch sac were then removed through the infraumbilical port site.   Inspection of the right upper quadrant was performed. No bleeding, bile duct injury or leak, or bowel injury was noted. The infra-umbilical port site fascia was closed with interrumpted 0 Vicryl sutures using  PMI/cone under direct visualization. Pneumoperitoneum was released and ports removed.  4-0 subcuticular Monocryl was used to close the skin. Dermabond was   applied.  The patient was then extubated and brought to the recovery room in stable condition. Sponge, lap, and needle counts were correct at closure and at the conclusion of the case.               Ronny Bacon, M.D., [facility] 11/06/2019 3:56 PM

## 2019-11-07 ENCOUNTER — Telehealth: Payer: Self-pay | Admitting: Emergency Medicine

## 2019-11-07 NOTE — Telephone Encounter (Signed)
Patient called stating he has a Lap Choley yesterday with Dr Christian Mate. Patient states he is having pain and a low grade fever and starting to have chills. Patient is at work right now and did not check temp with thermostat just feels. Patient only took Ibuprofen for pain.  Advised patient to alternate Ibuprofen with Tylenol. Tylenol should help alleviate pain and fevers. Patient also advised to take the Norco if needed for pain. Advised patient if he does take the Norco he cannot drive. Advised patient to call the office if he has any concerns.  Pt agrees and voiced understanding.

## 2019-11-08 LAB — SURGICAL PATHOLOGY

## 2019-11-21 ENCOUNTER — Other Ambulatory Visit: Payer: Self-pay

## 2019-11-21 ENCOUNTER — Encounter: Payer: Self-pay | Admitting: Surgery

## 2019-11-21 ENCOUNTER — Ambulatory Visit (INDEPENDENT_AMBULATORY_CARE_PROVIDER_SITE_OTHER): Payer: BC Managed Care – PPO | Admitting: Surgery

## 2019-11-21 VITALS — BP 118/81 | HR 66 | Temp 98.1°F | Resp 12 | Ht 71.0 in | Wt 215.8 lb

## 2019-11-21 DIAGNOSIS — Z9049 Acquired absence of other specified parts of digestive tract: Secondary | ICD-10-CM

## 2019-11-21 NOTE — Progress Notes (Signed)
[facility] POST-OP OFFICE VISIT  11/21/2019  HPI: Jason Petersen is a 44 y.o. male 15 days s/p robotic cholecystectomy. Path confirms chronic calculus cholecystitis as expected.  He had a sense of chills in the first day postop.  But this resolved.  He avoided taking any narcotic pain medication.  Reports his pain has diminished over the last 10 days.  Denies fevers, chills, nausea or vomiting.  Reports good bowel movements and good appetite.  Denies any issues with any of his incisions.  Vital signs: BP 118/81   Pulse 66   Temp 98.1 F (36.7 C)   Resp 12   Ht 5\' 11"  (1.803 m)   Wt 215 lb 12.8 oz (97.9 kg)   SpO2 97%   BMI 30.10 kg/m    Physical Exam: Constitutional: Appears well Abdomen: Soft and benign, nontender. Skin: Incisions are clean, dry and intact.  No ecchymosis, no underlying mass-effect.  Assessment/Plan: This is a 44 y.o. male 15 days s/p robotic cholecystectomy, progressing well.  Patient Active Problem List   Diagnosis Date Noted  . CCC (chronic calculous cholecystitis) 10/22/2019  . Hematochezia   . Polyp of sigmoid colon   . Gastroesophageal reflux disease without esophagitis 11/12/2018  . Glucosuria 11/12/2018  . Mild persistent asthma without complication 96/[id number]/9163    -Advised regarding following up with lifting precautions.  Continue to progress with activities and he is already relatively cautious with his diet as his wife is eating a post cholecystectomy regimen.  Advised him to follow-up as needed for any new concerns or changes.   Ronny Bacon M.D., FACS 11/21/2019, 10:49 AM

## 2019-11-21 NOTE — Patient Instructions (Signed)
Follow up as needed, call the office if you have any questions or concerns.   GENERAL POST-OPERATIVE PATIENT INSTRUCTIONS   WOUND CARE INSTRUCTIONS:  Keep a dry clean dressing on the wound if there is drainage. The initial bandage may be removed after 24 hours.  Once the wound has quit draining you may leave it open to air.  If clothing rubs against the wound or causes irritation and the wound is not draining you may cover it with a dry dressing during the daytime.  Try to keep the wound dry and avoid ointments on the wound unless directed to do so.  If the wound becomes bright red and painful or starts to drain infected material that is not clear, please contact your physician immediately.  If the wound is mildly pink and has a thick firm ridge underneath it, this is normal, and is referred to as a healing ridge.  This will resolve over the next 4-6 weeks.  BATHING: You may shower if you have been informed of this by your surgeon. However, Please do not submerge in a tub, hot tub, or pool until incisions are completely sealed or have been told by your surgeon that you may do so.  DIET:  You may eat any foods that you can tolerate.  It is a good idea to eat a high fiber diet and take in plenty of fluids to prevent constipation.  If you do become constipated you may want to take a mild laxative or take ducolax tablets on a daily basis until your bowel habits are regular.  Constipation can be very uncomfortable, along with straining, after recent surgery.  ACTIVITY:  You are encouraged to cough and deep breath or use your incentive spirometer if you were given one, every 15-30 minutes when awake.  This will help prevent respiratory complications and low grade fevers post-operatively if you had a general anesthetic.  You may want to hug a pillow when coughing and sneezing to add additional support to the surgical area, if you had abdominal or chest surgery, which will decrease pain during these times.  You  are encouraged to walk and engage in light activity for the next two weeks.  You should not lift more than 10-15 pounds, 4-6 weeks after surgery as it could put you at increased risk for complications.  Twenty pounds is roughly equivalent to a plastic bag of groceries. At that time- Listen to your body when lifting, if you have pain when lifting, stop and then try again in a few days. Soreness after doing exercises or activities of daily living is normal as you get back in to your normal routine.  MEDICATIONS:  Try to take narcotic medications and anti-inflammatory medications, such as tylenol, ibuprofen, naprosyn, etc., with food.  This will minimize stomach upset from the medication.  Should you develop nausea and vomiting from the pain medication, or develop a rash, please discontinue the medication and contact your physician.  You should not drive, make important decisions, or operate machinery when taking narcotic pain medication.  SUNBLOCK Use sun block to incision area over the next year if this area will be exposed to sun. This helps decrease scarring and will allow you avoid a permanent darkened area over your incision.  QUESTIONS:  Please feel free to call our office if you have any questions, and we will be glad to assist you.

## 2020-03-31 ENCOUNTER — Other Ambulatory Visit: Payer: Self-pay

## 2020-03-31 ENCOUNTER — Ambulatory Visit (INDEPENDENT_AMBULATORY_CARE_PROVIDER_SITE_OTHER): Payer: BC Managed Care – PPO | Admitting: Urology

## 2020-03-31 VITALS — BP 125/83 | HR 77 | Ht 71.0 in | Wt 214.0 lb

## 2020-03-31 DIAGNOSIS — M6289 Other specified disorders of muscle: Secondary | ICD-10-CM | POA: Diagnosis not present

## 2020-03-31 DIAGNOSIS — N4889 Other specified disorders of penis: Secondary | ICD-10-CM

## 2020-03-31 DIAGNOSIS — N411 Chronic prostatitis: Secondary | ICD-10-CM | POA: Diagnosis not present

## 2020-03-31 LAB — BLADDER SCAN AMB NON-IMAGING: Scan Result: 58

## 2020-03-31 NOTE — Progress Notes (Signed)
03/31/2020 9:28 AM   Jason Petersen January 23, 1976 [id number]  Referring provider: Derinda Late, MD [phone] [address] [facility] [place],  [place] [address]  Chief Complaint  Patient presents with  . Benign Prostatic Hypertrophy    HPI: 45 yo M with h/p BPH/ chronic prostatis who returns today for 6 month follow up.  He initially presented with urinary frequency along with penile pain and concern for pelvic floor dysfunction.  He improved with physical therapy.  He did undergo cystoscopy which was fairly unremarkable as well as prostate sizing indicating a 48.9 g prostate.  Today, he reports that he is doing fairly well.  After working in person with physical therapy, he pursue this independently and has been doing additional therapy/exercises with a self program.  He describes one episode of penile pain associated with intercourse.  He reports that this was exacerbated by anxiety on this particular occasion.  Otherwise, he is able to pursue sexual activity with minimal discomfort for the most part.  In terms of urination, he is also doing well.  He is able to empty his bladder sufficiently and is frequency/urgency have subsided.   IPSS    Row Name 03/31/20 0800         International Prostate Symptom Score   How often have you had the sensation of not emptying your bladder? Not at All     How often have you had to urinate less than every two hours? Less than 1 in 5 times     How often have you found you stopped and started again several times when you urinated? Not at All     How often have you found it difficult to postpone urination? Not at All     How often have you had a weak urinary stream? Not at All     How often have you had to strain to start urination? Not at All     How many times did you typically get up at night to urinate? None     Total IPSS Score 1           Quality of Life due to urinary symptoms   If you were to spend  the rest of your life with your urinary condition just the way it is now how would you feel about that? Pleased            Score:  1-7 Mild 8-19 Moderate 20-35 Severe    PMH: Past Medical History:  Diagnosis Date  . Asthma   . GERD (gastroesophageal reflux disease)     Surgical History: Past Surgical History:  Procedure Laterality Date  . COLONOSCOPY WITH PROPOFOL N/A 05/10/2019   Procedure: COLONOSCOPY WITH PROPOFOL;  Surgeon: Lucilla Lame, MD;  Location: [facility] ENDOSCOPY;  Service: Endoscopy;  Laterality: N/A;  . ESOPHAGOGASTRODUODENOSCOPY (EGD) WITH PROPOFOL N/A 05/10/2019   Procedure: ESOPHAGOGASTRODUODENOSCOPY (EGD) WITH PROPOFOL;  Surgeon: Lucilla Lame, MD;  Location: [facility] ENDOSCOPY;  Service: Endoscopy;  Laterality: N/A;  . NASAL SINUS SURGERY    . SINUS SURGERY WITH INSTATRAK    . tubs in ears      Home Medications:  Allergies as of 03/31/2020   No Known Allergies     Medication List       Accurate as of March 31, 2020  9:28 AM. If you have any questions, ask your nurse or doctor.        STOP taking these medications   dicyclomine 10  MG capsule Commonly known as: Bentyl Stopped by: [name], MD     TAKE these medications   cholecalciferol 25 MCG (1000 UNIT) tablet Commonly known as: VITAMIN D3 Take 1,000 Units by mouth daily.   mometasone 50 MCG/ACT nasal spray Commonly known as: NASONEX Place 2 sprays into the nose daily.   pantoprazole 40 MG tablet Commonly known as: PROTONIX Take 1 tablet (40 mg total) by mouth daily.       Allergies: No Known Allergies  Family History: Family History  Problem Relation Age of Onset  . Ovarian cancer Mother   . Brain cancer Sister     Social History:  reports that he has never smoked. He has never used smokeless tobacco. He reports current alcohol use. He reports that he does not use drugs.   Physical Exam: BP 125/83   Pulse 77   Ht 5\' 11"  (1.803 m)   Wt 214 lb (97.1 kg)   BMI 29.85 kg/m    Constitutional:  Alert and oriented, No acute distress. HEENT: [place] AT, moist mucus membranes.  Trachea midline, no masses. Cardiovascular: No clubbing, cyanosis, or edema. Respiratory: Normal respiratory effort, no increased work of breathing. Skin: No rashes, bruises or suspicious lesions. Neurologic: Grossly intact, no focal deficits, moving all 4 extremities. Psychiatric: Normal mood and affect.  Pertinent Imaging: Results for orders placed or performed in visit on 03/31/20  BLADDER SCAN AMB NON-IMAGING  Result Value Ref Range   Scan Result 58 ml     Assessment & Plan:    1. Pelvic floor dysfunction Continue to work on pelvic floor therapy, has improved both urinary and sexual function  2. Penile pain Significantly improved  3. Chronic prostatitis Improved - BLADDER SCAN AMB NON-IMAGING  Fairly well.  We will see him on a as needed basis if he has exacerbation of his urinary symptoms.  He is agreeable this plan.  [name], MD  [facility] [address] [place], [place] [address] [phone]

## 2020-09-04 NOTE — Progress Notes (Signed)
09/08/20 9:11 AM   [name] 02-14-1975 [id number]  Referring provider:  Derinda Late, MD [phone] [address] [place] [facility] [place],  [place] [address] Chief Complaint  Patient presents with   Prostatitis     HPI: Jason Petersen is a 45 y.o.male with a personal history of chronic prostatitis, chronic pelvic pain, and history of penile pain who returns to the office today at his request.  Notably, he had been improving after physical therapy.  Today, he reports that he made this appointment last week when he started to have right testicular, groin pain as well as some dysuria urgency frequency.  This started immediately after intercourse.  He does not recall actively traumatizing his right testicle but it started to hurt during intercourse and has persisted.  The urgency frequency and dysuria lasted a few days and now is completely resolved.    He continues to have some discomfort in his right inguinal and testicular area.  He describes it as itching and burning.  He also thinks that his right testicle may be mildly swollen.  In the past, has been diagnosed with epididymitis per his report.  He has not tried any interventions to improve it.   PMH: Past Medical History:  Diagnosis Date   Asthma    GERD (gastroesophageal reflux disease)     Surgical History: Past Surgical History:  Procedure Laterality Date   COLONOSCOPY WITH PROPOFOL N/A 05/10/2019   Procedure: COLONOSCOPY WITH PROPOFOL;  Surgeon: Lucilla Lame, MD;  Location: [facility] ENDOSCOPY;  Service: Endoscopy;  Laterality: N/A;   ESOPHAGOGASTRODUODENOSCOPY (EGD) WITH PROPOFOL N/A 05/10/2019   Procedure: ESOPHAGOGASTRODUODENOSCOPY (EGD) WITH PROPOFOL;  Surgeon: Lucilla Lame, MD;  Location: [facility] ENDOSCOPY;  Service: Endoscopy;  Laterality: N/A;   NASAL SINUS SURGERY     SINUS SURGERY WITH INSTATRAK     tubs in ears      Home Medications:  Allergies as of 09/08/2020   No Known  Allergies      Medication List        Accurate as of September 08, 2020  9:11 AM. If you have any questions, ask your nurse or doctor.          cholecalciferol 25 MCG (1000 UNIT) tablet Commonly known as: VITAMIN D3 Take 1,000 Units by mouth daily.   mirtazapine 15 MG tablet Commonly known as: REMERON Take 15 mg by mouth at bedtime.   mometasone 50 MCG/ACT nasal spray Commonly known as: NASONEX Place 2 sprays into the nose daily.   pantoprazole 40 MG tablet Commonly known as: PROTONIX Take 1 tablet (40 mg total) by mouth daily.        Allergies: No Known Allergies  Family History: Family History  Problem Relation Age of Onset   Ovarian cancer Mother    Brain cancer Sister     Social History:  reports that he has never smoked. He has never used smokeless tobacco. He reports current alcohol use. He reports that he does not use drugs.   Physical Exam: BP 131/85   Pulse 75   Ht '5\' 11"'$  (1.803 m)   Wt 214 lb (97.1 kg)   BMI 29.85 kg/m   Constitutional:  Alert and oriented, No acute distress. HEENT: [place] AT, moist mucus membranes.  Trachea midline, no masses. Cardiovascular: No clubbing, cyanosis, or edema. Respiratory: Normal respiratory effort, no increased work of breathing. GI: Abdomen is soft, nontender, nondistended, no abdominal masses GU: Normal phallus without discharge.  Bilateral descended  testicles, nontender, no masses, no swelling or abnormalities.  No skin changes.  No adenopathy. Skin: No rashes, bruises or suspicious lesions. Neurologic: Grossly intact, no focal deficits, moving all 4 extremities. Psychiatric: Normal mood and affect.  Laboratory Data:  Lab Results  Component Value Date   CREATININE 1.02 10/14/2019    Urinalysis Urinalysis reviewed, negative   Assessment & Plan:    1. Right testicular pain He was reassured today that his exam is completely normal without any epididymal tenderness, swelling, or anything else suggestive of  infection  He may have had some inflammatory epididymitis which is now resolving or some mild trauma but overall, his exam and labs are completely reassuring  I recommended supportive care with tight underwear and NSAIDs for few days, return if fails to resolve  2. Urinary frequency Resovled, UA negative - Urinalysis, Complete   F/u prn  [facility] [address], [place], [place] [address] [phone]

## 2020-09-08 ENCOUNTER — Encounter: Payer: Self-pay | Admitting: Urology

## 2020-09-08 ENCOUNTER — Ambulatory Visit (INDEPENDENT_AMBULATORY_CARE_PROVIDER_SITE_OTHER): Payer: BC Managed Care – PPO | Admitting: Urology

## 2020-09-08 ENCOUNTER — Other Ambulatory Visit: Payer: Self-pay

## 2020-09-08 VITALS — BP 131/85 | HR 75 | Ht 71.0 in | Wt 214.0 lb

## 2020-09-08 DIAGNOSIS — R350 Frequency of micturition: Secondary | ICD-10-CM

## 2020-09-08 DIAGNOSIS — N50811 Right testicular pain: Secondary | ICD-10-CM | POA: Diagnosis not present

## 2020-09-08 LAB — URINALYSIS, COMPLETE
Bilirubin, UA: NEGATIVE
Glucose, UA: NEGATIVE
Ketones, UA: NEGATIVE
Leukocytes,UA: NEGATIVE
Nitrite, UA: NEGATIVE
Protein,UA: NEGATIVE
RBC, UA: NEGATIVE
Specific Gravity, UA: 1.015 (ref 1.005–1.030)
Urobilinogen, Ur: 0.2 mg/dL (ref 0.2–1.0)
pH, UA: 5.5 (ref 5.0–7.5)

## 2020-09-08 LAB — MICROSCOPIC EXAMINATION
Bacteria, UA: NONE SEEN
Epithelial Cells (non renal): NONE SEEN /hpf (ref 0–10)
RBC, Urine: NONE SEEN /hpf (ref 0–2)
WBC, UA: NONE SEEN /hpf (ref 0–5)

## 2020-10-06 NOTE — Progress Notes (Signed)
10/07/20 4:02 PM   [name] 17-Sep-1975 VA:[id number]  Referring provider:  Derinda Late, MD [phone] [address] [facility] [place],  [place] [address] Chief Complaint  Patient presents with   Testicle Pain     HPI: Jason Petersen is a 45 y.o.male with a personal history of chronic prostatitis, chronic pelvic pain, and history of penile pain who returns today for follow-up for pain in scrotum area.   He had previously been improving with physical therapy but returned last month with new onset worsening right testicular pain.  His scrotal exam on 09/08/2020 was normal without any epididymal tenderness, swelling, or suggestion of infection.   He reports today  that a week after his last visit his testicle started to feel swollen. He has been experiencing extreme pain stating it feel "as if he is being stung by wasps". He has been taking ibuprofen twice a day, it has not helped much and has caused an upset stomach. He has experienced sweats. On days that he felt he was swelling there was evidence of slight enlargement. He sates he is stressed recently but it is nothing out of the ordinary.  He has still been doing pelvic floor exercises.   PMH: Past Medical History:  Diagnosis Date   Asthma    GERD (gastroesophageal reflux disease)     Surgical History: Past Surgical History:  Procedure Laterality Date   COLONOSCOPY WITH PROPOFOL N/A 05/10/2019   Procedure: COLONOSCOPY WITH PROPOFOL;  Surgeon: Lucilla Lame, MD;  Location: [facility] ENDOSCOPY;  Service: Endoscopy;  Laterality: N/A;   ESOPHAGOGASTRODUODENOSCOPY (EGD) WITH PROPOFOL N/A 05/10/2019   Procedure: ESOPHAGOGASTRODUODENOSCOPY (EGD) WITH PROPOFOL;  Surgeon: Lucilla Lame, MD;  Location: [facility] ENDOSCOPY;  Service: Endoscopy;  Laterality: N/A;   NASAL SINUS SURGERY     SINUS SURGERY WITH INSTATRAK     tubs in ears      Home Medications:  Allergies as of 10/07/2020   No Known Allergies       Medication List        Accurate as of October 07, 2020  4:02 PM. If you have any questions, ask your nurse or doctor.          STOP taking these medications    mometasone 50 MCG/ACT nasal spray Commonly known as: NASONEX Stopped by: [name], MD       TAKE these medications    cholecalciferol 25 MCG (1000 UNIT) tablet Commonly known as: VITAMIN D3 Take 1,000 Units by mouth daily.   mirtazapine 15 MG tablet Commonly known as: REMERON Take 15 mg by mouth at bedtime.   pantoprazole 40 MG tablet Commonly known as: PROTONIX Take 1 tablet (40 mg total) by mouth daily.        Allergies: No Known Allergies  Family History: Family History  Problem Relation Age of Onset   Ovarian cancer Mother    Brain cancer Sister     Social History:  reports that he has never smoked. He has never used smokeless tobacco. He reports current alcohol use. He reports that he does not use drugs.   Physical Exam: BP 136/81   Pulse 82   Ht '5\' 11"'$  (1.803 m)   Wt 212 lb (96.2 kg)   BMI 29.57 kg/m   Constitutional:  Alert and oriented, No acute distress. HEENT: [place] AT, moist mucus membranes.  Trachea midline, no masses. Cardiovascular: No clubbing, cyanosis, or edema. Respiratory: Normal respiratory effort, no increased work of breathing. Scrotal  exam: Normal phallus without discharge, bilateral descended testicles, slight ender in right inguinal area without obvious hernia.  no masses, no swelling or abnormalities, no skin changes or adenopathy.  Skin: No rashes, bruises or suspicious lesions. Neurologic: Grossly intact, no focal deficits, moving all 4 extremities. Psychiatric: Normal mood and affect.  Laboratory Data:  Lab Results  Component Value Date   CREATININE 1.02 10/14/2019   UA today is negative, see epic  Assessment & Plan:    Right testicular pain  - Reassured that his exam was unremarkable today without any epididymal tenderness, swelling, or  anything suggestive of infection - Scrotal ultrasound with doppler ordered but do not anticipate we will see any pathology. Do no anticipate structural abnormalities. - Continue to wear tight underwear and supportive care  - Meloxicam for pain due to intolerance of ibuprofen  - Referred back to PT  -Advised to manage stress as this is likely contributing factor especially with a personal history of pelvic/genital pain/pelvic floor dysfunction    I,Kailey Littlejohn,acting as a scribe for [name], MD.,have documented all relevant documentation on the behalf of [name], MD,as directed by  [name], MD while in the presence of [name], MD.  I have reviewed the above documentation for accuracy and completeness, and I agree with the above.   [name], MD   [facility] [address], [place], [place] [address] [phone]

## 2020-10-07 ENCOUNTER — Other Ambulatory Visit: Payer: Self-pay

## 2020-10-07 ENCOUNTER — Ambulatory Visit (INDEPENDENT_AMBULATORY_CARE_PROVIDER_SITE_OTHER): Payer: BC Managed Care – PPO | Admitting: Urology

## 2020-10-07 VITALS — BP 136/81 | HR 82 | Ht 71.0 in | Wt 212.0 lb

## 2020-10-07 DIAGNOSIS — N50811 Right testicular pain: Secondary | ICD-10-CM

## 2020-10-07 MED ORDER — MELOXICAM 7.5 MG PO TABS: 7.5000 mg | ORAL_TABLET | Freq: Every day | ORAL | 0 refills | Status: DC

## 2020-10-08 LAB — URINALYSIS, COMPLETE
Bilirubin, UA: NEGATIVE
Ketones, UA: NEGATIVE
Leukocytes,UA: NEGATIVE
Nitrite, UA: NEGATIVE
Protein,UA: NEGATIVE
RBC, UA: NEGATIVE
Specific Gravity, UA: 1.02 (ref 1.005–1.030)
Urobilinogen, Ur: 1 mg/dL (ref 0.2–1.0)
pH, UA: 6.5 (ref 5.0–7.5)

## 2020-10-08 LAB — MICROSCOPIC EXAMINATION
Bacteria, UA: NONE SEEN
Epithelial Cells (non renal): NONE SEEN /hpf (ref 0–10)

## 2020-11-10 ENCOUNTER — Other Ambulatory Visit: Payer: Self-pay

## 2020-11-10 ENCOUNTER — Ambulatory Visit
Admission: RE | Admit: 2020-11-10 | Discharge: 2020-11-10 | Disposition: A | Discharge status: Home / Self Care | Payer: BC Managed Care – PPO | Source: Ambulatory Visit | Attending: Urology | Admitting: Urology

## 2020-11-10 DIAGNOSIS — N50811 Right testicular pain: Secondary | ICD-10-CM | POA: Diagnosis not present

## 2021-02-03 ENCOUNTER — Ambulatory Visit: Payer: BC Managed Care – PPO | Attending: Urology | Admitting: Physical Therapy

## 2021-02-03 ENCOUNTER — Encounter: Payer: Self-pay | Admitting: Physical Therapy

## 2021-02-03 ENCOUNTER — Other Ambulatory Visit: Payer: Self-pay

## 2021-02-03 DIAGNOSIS — R293 Abnormal posture: Secondary | ICD-10-CM | POA: Insufficient documentation

## 2021-02-03 DIAGNOSIS — M542 Cervicalgia: Secondary | ICD-10-CM | POA: Insufficient documentation

## 2021-02-03 DIAGNOSIS — R2689 Other abnormalities of gait and mobility: Secondary | ICD-10-CM | POA: Insufficient documentation

## 2021-02-03 DIAGNOSIS — M25561 Pain in right knee: Secondary | ICD-10-CM | POA: Diagnosis present

## 2021-02-03 DIAGNOSIS — M25552 Pain in left hip: Secondary | ICD-10-CM | POA: Insufficient documentation

## 2021-02-03 DIAGNOSIS — R278 Other lack of coordination: Secondary | ICD-10-CM | POA: Diagnosis present

## 2021-02-03 DIAGNOSIS — R102 Pelvic and perineal pain: Secondary | ICD-10-CM | POA: Insufficient documentation

## 2021-02-03 NOTE — Patient Instructions (Signed)
°  Lengthen Back rib by R  shoulder    Lie on L  side , pillow between knees and under head  Pull  arm overhead over mattress, grab the edge of mattress,pull it upward, drawing elbow away from ears  Breathing 10 reps  Open book  Lying on  _ side , rotating 3/4 turn  R_ only this week ,dragging top palm on body , do not   lift it Rotating onto pillow /yoga block  Pillow/ Block between knees  10 reps  Angel wings  Onyour back  Elbows bent, dragging both arms on bed palms overhead and then lower, elbow   __ 2 x day

## 2021-02-04 NOTE — Therapy (Addendum)
[place] MAIN [facility] SERVICES [place], [place], [address] Phone: [phone]   Fax:  [phone]  Physical Therapy Evaluation  Patient Details  Name: Jason Petersen MRN: [id number] Date of Birth: [birth year] Referring Provider (PT): BrandonMD   Encounter Date: 02/03/2021    Past Medical History:  Diagnosis Date   Asthma    GERD (gastroesophageal reflux disease)     Past Surgical History:  Procedure Laterality Date   COLONOSCOPY WITH PROPOFOL N/A 05/10/2019   Procedure: COLONOSCOPY WITH PROPOFOL;  Surgeon: Lucilla Lame, MD;  Location[facility] ENDOSCOPY;  Service: Endoscopy;  Laterality: N/A;   ESOPHAGOGASTRODUODENOSCOPY (EGD) WITH PROPOFOL N/A 05/10/2019   Procedure: ESOPHAGOGASTRODUODENOSCOPY (EGD) WITH PROPOFOL;  Surgeon: Lucilla Lame, MD;  Location: [facility] ENDOSCOPY;  Service: Endoscopy;  Laterality: N/A;   NASAL SINUS SURGERY     SINUS SURGERY WITH INSTATRAK     tubs in ears      There were no vitals filed for this visit.    Subjective Assessment - 02/04/21 1707     Subjective 1) R testicular pain is not as bad as it used to be when he came for Pelvic PT over 1 year ago. He thinks it is stress triggered. Pain is at 5/10 when previously prior to Pelvic PT one year ago. Pain lasts for a couple of days. Urine stream becomes narrowed and pt has freqeunt urination. Pt saw urologist and [place] found a small cyst. Doctor was not able to find the swollen area but pt feels it easier when lying. This area went away after a couple of weeks.    2) Neck and shoulder pain: 8/10 occurs almost constantly and not dependent on positions. Startes between shoulder blades to neck and along shoulders. Pain started at least 6 months. Pt used to sleep on his belly but now the pain forces him to sleep on back and his sides. Pt uses a pillow between knees sometimes. Numbness / tingling occur down mostly R arm to first 3 fingers and sometimes all the way to R  foot. Pt tried massage therapy a dozens times and the muscle knots keep coming back. Currently L shoulder snaps and cracks when moving arm and pulling pect mm. 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   3 ) L hip pain at groin that is an ache when he pulls knee up to chest, walking for most of the day. This pain has been present for years. Denied radiating pain. 7/10. Pain eases quickly. Physical routine: walking 30 min 5 days a week.     Pertinent History Family Hx of cancer,  anxiety, stress is related to work , relaxation techniques: playing [place] with his wife, watching car crast videos. Pt has an upcoming appt with PCP to discuss other pain relief options because anti-anxiety medications did not work. Psychotherapy is one of the options to be discussed.    Patient Stated Goals not be in constant pain                [facility] PT Assessment - 02/04/21 1612       Assessment   Medical Diagnosis testicular pain    Referring Provider (PT) BrandonMD      Precautions   Precautions None      Restrictions   Weight Bearing Restrictions No      Balance Screen   Has the patient fallen in the past  6 months No                        Objective measurements completed on examination: See above findings.       [place] Adult PT Treatment/Exercise - 02/04/21 1653       Neuro Re-ed    Neuro Re-ed Details  cued for HEP      Manual Therapy   Manual therapy comments STM/ MWM at R thoracic segments , medial scapula mm                          PT Long Term Goals - 02/04/21 1707       PT LONG TERM GOAL #1   Title Pt will demo no R thoracic convex curve and more levelled shoulder, no scapular dyskinesis on R across 2 weeks in order to minimize neck/ shoulder pain and perform work with less numbness    Time 2    Period Weeks    Status New    Target Date 02/18/21      PT LONG TERM GOAL  #2   Title Pt will demo proper deep core coordination to minimize pelvic pain    Time 6    Period Weeks    Status New    Target Date 03/18/21      PT LONG TERM GOAL #3   Title Pt will improve Neck FOTO scoreby > 5 pts from 57 pts in order to improve QOL    Time 10    Period Weeks    Status New    Target Date 04/15/21      PT LONG TERM GOAL #4   Title Pt will improve FOTO pelvicpain score from 8 pt by a > 2 pt change in order to urinate with normal stream instead of narrow stream and less frequency.    Time 10    Period Weeks    Status New    Target Date 04/15/21      PT LONG TERM GOAL #5   Title Pt will report noL hip/ groin ache across 1 week when bending knee towards chest and stairs navigation    Time 10    Period Weeks    Status New    Target Date 04/15/21                                         Plan - 02/04/21 1815              Clinical Impression Statement  Pt is a  46  yo  who presents with R testicular pain that impacts his urine stream and increases his urinary frequency.Pt also c/o neck and shoulder pain with radicular Sx and L hip/ groin ache which impact his QOL , ADLs, and work duties.  Pt's musculoskeletal assessment revealed thoracic convex curve with uneven shoulder height, limited cervical mobility with pain. Plan to assess abdominal/pelvic floor mm at upcoming session because pt voiced he is more concerned about his neck / shoulder issues today. Pelvic pain has improved a lot since he completed Pelvic PT over 1 year ago and pain / urinary frequency issue is not as severe compared to that timepoint before starting Pelvic PT.   Regional interdependent approaches will yield greater benefits in pt's POC due to the  significant impact their Sx have had on their QOL.   Following Tx today which pt tolerated without complaints, pt demo'd equal alignment of shoulders and no more convex curve at thoracic spine. Pt benefits from skilled PT. Plan to assess pelvic issues next session and continue to progress with cervico-thoracic area. Requested pt bring in a picture of his work station to assess impact on his neck / shoulder issues.     Examination-Activity Limitations Sleep;Sit;Locomotion Level;Stand    Stability/Clinical Decision Making Evolving/Moderate complexity    Rehab Potential Good    PT Frequency 1x / week    PT Duration Other (comment)   10   PT Treatment/Interventions ADLs/Self Care Home Management;Moist Heat;Functional mobility training;Stair training;Patient/family education;Wheelchair mobility training;Therapeutic exercise;Therapeutic activities;Balance training;Neuromuscular re-education;Gait training;Manual lymph drainage;Manual techniques;Taping;Energy conservation;Dry needling;Scar mobilization;Spinal Manipulations;Joint Manipulations;Traction;Cryotherapy    Consulted and Agree with Plan of Care Patient             Patient will benefit from skilled therapeutic intervention in order to improve the following deficits and impairments:  Decreased activity tolerance, Decreased balance, Decreased scar mobility,  Decreased mobility, Decreased strength, Impaired sensation, Postural dysfunction, Impaired flexibility, Improper body mechanics, Hypomobility, Decreased skin integrity, Decreased endurance, Abnormal gait, Increased muscle spasms, Impaired perceived functional ability, Increased fascial restricitons, Decreased range of motion, Decreased safety awareness, Pain, Difficulty walking, Decreased coordination  Visit Diagnosis: Abnormal posture - Plan: PT plan of care cert/re-cert  Neck pain - Plan: PT plan of care cert/re-cert  Other lack of coordination - Plan: PT plan of care cert/re-cert  Pelvic pain - Plan: PT plan of care cert/re-cert  Left hip pain-Plan: PT plan of care cert/re-cert     Problem List Patient Active Problem List   Diagnosis Date Noted   Status post laparoscopic cholecystectomy 11/21/2019   Hematochezia    Polyp of sigmoid colon    Gastroesophageal reflux disease without esophagitis 11/12/2018   Glucosuria 11/12/2018   Mild persistent asthma without complication 29/92/4268    Jerl Mina, PT 02/04/2021, 6:30 PM  [place] [address] [place], [place], [address] Phone: [phone]   Fax:  [phone]  Name: Jason Petersen MRN: [id number] Date of Birth: 13-Mar-1975

## 2021-02-08 NOTE — Addendum Note (Signed)
Addended by: Jerl Mina on: 02/08/2021 03:53 PM   Modules accepted: Orders

## 2021-02-10 ENCOUNTER — Other Ambulatory Visit: Payer: Self-pay

## 2021-02-10 ENCOUNTER — Ambulatory Visit: Payer: BC Managed Care – PPO | Admitting: Physical Therapy

## 2021-02-10 DIAGNOSIS — M25552 Pain in left hip: Secondary | ICD-10-CM

## 2021-02-10 DIAGNOSIS — R293 Abnormal posture: Secondary | ICD-10-CM | POA: Diagnosis not present

## 2021-02-10 DIAGNOSIS — R102 Pelvic and perineal pain: Secondary | ICD-10-CM

## 2021-02-10 DIAGNOSIS — M542 Cervicalgia: Secondary | ICD-10-CM

## 2021-02-10 DIAGNOSIS — R278 Other lack of coordination: Secondary | ICD-10-CM

## 2021-02-10 NOTE — Therapy (Addendum)
[place] MAIN [facility] SERVICES [address] [place], [place], [address] Phone: [phone]   Fax:  [phone]  Physical Therapy Treatment  Patient Details  Name: Jason Petersen MRN: [id number] Date of Birth: 28-Dec-1975 Referring Provider (PT): BrandonMD   Encounter Date: 02/10/2021   PT End of Session - 02/10/21 0821     Visit Number 2    Number of Visits 10    Date for PT Re-Evaluation 04/14/21    PT Start Time 0812    PT Stop Time 0900    PT Time Calculation (min) 48 min    Activity Tolerance Patient tolerated treatment well    Behavior During Therapy [facility] for tasks assessed/performed             Past Medical History:  Diagnosis Date   Asthma    GERD (gastroesophageal reflux disease)     Past Surgical History:  Procedure Laterality Date   COLONOSCOPY WITH PROPOFOL N/A 05/10/2019   Procedure: COLONOSCOPY WITH PROPOFOL;  Surgeon: Lucilla Lame, MD;  [facility] ENDOSCOPY;  Service: Endoscopy;  Laterality: N/A;   ESOPHAGOGASTRODUODENOSCOPY (EGD) WITH PROPOFOL N/A 05/10/2019   Procedure: ESOPHAGOGASTRODUODENOSCOPY (EGD) WITH PROPOFOL;  Surgeon: Lucilla Lame, MD;  Location: [facility] ENDOSCOPY;  Service: Endoscopy;  Laterality: N/A;   NASAL SINUS SURGERY     SINUS SURGERY WITH INSTATRAK     tubs in ears      There were no vitals filed for this visit.   Subjective Assessment - 02/10/21 0822     Subjective pt felt 5-10% improvement in his shoulder after last session and it lasted a couple of days. Yesterday pt felt B his shoulders were on fire. Testicular pain has not been a problem.    Pertinent History Family Hx of cancer,  anxiety, stress is related to work , relaxation techniques: playing [place] with his wife, watching car crast videos. Pt has an upcoming appt with PCP to discuss other pain relief options because anti-anxiety medications did not work. Psychotherapy is one of the options to be discussed.    Patient Stated Goals  not be in constant pain                [facility] PT Assessment - 02/10/21 0841       Observation/Other Assessments   Observations poor segmental movement of trunk. pelvis      Posture/Postural Control   Posture Comments no trunk pertubation with swimmers exercise     / pillow underbelly      Strength   Overall Strength Comments cervical endurance test : 1:05 before c/o tingling in arm     Palpation   Spinal mobility tightness/ tenderness at occiput R > L, ulnar nn Sx with distraction, ulnar nn glide centralized Sx  [place] Adult PT Treatment/Exercise - 02/10/21 0841       Neuro Re-ed    Neuro Re-ed Details  cued for techniquefor resistance band HEP to increase cervical endurance and multifidis      Exercises   Other Exercises  see pt instructions      Manual Therapy   Manual therapy comments STM/MWM neck mm attached to occiput,and distraction at occiput,                                      PT Long Term Goals - 02/10/21 0837       PT LONG TERM GOAL #1   Title Pt will demo no R thoracic convex curve and more levelled shoulder, no scapular dyskinesis on R across 2 weeks in order to minimize neck/ shoulder pain and perform work with less numbness    Time 2    Period Weeks    Status Partially Met    Target Date 02/18/21      PT LONG TERM GOAL #2   Title Pt will demo proper deep core coordination to minimize pelvic pain    Time 6    Period Weeks    Status On-going    Target Date 03/18/21      PT LONG TERM GOAL #3   Title Pt will improve Neck FOTO score by > 5 pts from 57 pts in order to improve QOL    Time 10    Period Weeks    Status On-going    Target Date  04/15/21      PT LONG TERM GOAL #4   Title Pt will improve FOTO pelvic        pain score from 8 pt by a > 2 pt change in order to urinate with normal stream instead of narrow stream and less frequency.    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    Time 8    Period Weeks    Status New    Target Date 04/07/21                   Plan - 02/10/21 1037     Clinical Impression Statement Testicular pain is no problem. Focusing on hisneck/ shoulder complaints. Pt had 5-10% improvement from last session with neck/ shoulder pain. Pt showed good carry over with no convex curve in thoracic spine and more levelled shoulders.  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   Manual Tx decreased tightness at occiput mm. Plan to perform more nn assessment to address tingling in arm.  Added multidifis strengthening due to increased lumbar lordosis/ promote trunk/pelvic stability. Plan to assess L/ groin   pain at upcoming session.                                    Pt continues to benefit from skilled PT.   Examination-Activity Limitations Sleep;Sit;Locomotion Level;Stand    Stability/Clinical Decision Making Evolving/Moderate complexity    Rehab Potential Good    PT Frequency 1x / week    PT  Duration Other (comment)   10   PT Treatment/Interventions ADLs/Self Care Home Management;Moist Heat;Functional mobility training;Stair training;Patient/family education;Wheelchair mobility training;Therapeutic exercise;Therapeutic activities;Balance training;Neuromuscular re-education;Gait training;Manual lymph drainage;Manual techniques;Taping;Energy conservation;Dry needling;Scar mobilization;Spinal Manipulations;Joint Manipulations;Traction;Cryotherapy    Consulted and Agree with Plan of Care Patient             Patient will benefit from skilled therapeutic intervention in order to improve the following deficits and impairments:  Decreased activity tolerance, Decreased balance, Decreased scar mobility, Decreased mobility, Decreased strength, Impaired sensation, Postural dysfunction, Impaired flexibility, Improper body mechanics, Hypomobility, Decreased skin integrity, Decreased endurance, Abnormal gait, Increased muscle spasms, Impaired perceived functional ability, Increased fascial restricitons, Decreased range of motion, Decreased safety awareness, Pain, Difficulty walking, Decreased coordination  Visit Diagnosis: Abnormal posture  Neck pain  Other lack of coordination  Pelvic pain  Left hip pain     Problem List Patient Active Problem List   Diagnosis Date Noted   Status post laparoscopic cholecystectomy 11/21/2019   Hematochezia    Polyp of sigmoid colon    Gastroesophageal reflux disease without esophagitis 11/12/2018   Glucosuria 11/12/2018   Mild persistent asthma without complication 50/93/2671    Jerl Mina, PT 02/10/2021, 10:38 AM  [place], [place], [address] Phone: [phone]   Fax:  [phone]  Name: Jason Petersen MRN: [id number] Date of Birth: September 17, 1975

## 2021-02-10 NOTE — Patient Instructions (Addendum)
Multifidis twist  Band is on doorknob: stand further away from door (facing perpendicular)   Twisting trunk without moving the hips and knees Hold band at the level of ribcage, elbows bent,shoulder blades roll back and down like squeezing a pencil under armpit    Exhale twist,.10-15 deg away from door without moving your hips/ knees. Continue to maintain equal weight through legs. Keep knee unlocked.  30 rep  __   Swimmers:  Pillow under belly  L arm up "half V"  - shoulders engaged and R leg up 30 deg       Switch  20 reps    ___  [place]man  Pillow under belly 5 sec holds, fingers spread by hips 10reps    ___   Seated "w" green band behind back and chin tuck  30 reps  ___

## 2021-02-17 ENCOUNTER — Ambulatory Visit: Payer: BC Managed Care – PPO | Admitting: Physical Therapy

## 2021-02-17 ENCOUNTER — Other Ambulatory Visit: Payer: Self-pay

## 2021-02-17 DIAGNOSIS — R102 Pelvic and perineal pain: Secondary | ICD-10-CM

## 2021-02-17 DIAGNOSIS — R2689 Other abnormalities of gait and mobility: Secondary | ICD-10-CM

## 2021-02-17 DIAGNOSIS — R278 Other lack of coordination: Secondary | ICD-10-CM

## 2021-02-17 DIAGNOSIS — M25552 Pain in left hip: Secondary | ICD-10-CM

## 2021-02-17 DIAGNOSIS — M542 Cervicalgia: Secondary | ICD-10-CM

## 2021-02-17 DIAGNOSIS — M25561 Pain in right knee: Secondary | ICD-10-CM

## 2021-02-17 DIAGNOSIS — R293 Abnormal posture: Secondary | ICD-10-CM | POA: Diagnosis not present

## 2021-02-17 NOTE — Patient Instructions (Signed)
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, shoulders down, chin tuck, no arched  low back     _wall stretch Clock stretch with head turns :  stand perpendicular to the wall, L side to the wall Tilt head to wall  Place L palm at 7 o clock Chin tuck like you are looking into armpit Look at "[place] on giant map  Swivel head with chin tucked to look in upper corner of ceiling as if you are look at  [place] on giant map "   5 reps   Switch direction, R palm on wall at 5 o 'clock   Chin tuck like you are looking into armpit Look at "[place]  on giant map  Swivel head with chin tucked to look in upper corner of ceiling as if you are look at  [facility] on giant map "   5 reps      Elbow circles back with chin tuck  __

## 2021-02-17 NOTE — Therapy (Signed)
[place] MAIN [facility] SERVICES [address] [place], [place], [address] Phone: [phone]   Fax:  [phone]  Physical Therapy Treatment  Patient Details  Name: Jason Petersen MRN: [id number] Date of Birth: Mar 27, 1975 Referring Provider (PT): BrandonMD   Encounter Date: 02/17/2021   PT End of Session - 02/17/21 0824     Visit Number 3    Number of Visits 10    Date for PT Re-Evaluation 04/14/21    PT Start Time 0823    PT Stop Time 0904    PT Time Calculation (min) 41 min    Activity Tolerance Patient tolerated treatment well    Behavior During Therapy [facility] for tasks assessed/performed             Past Medical History:  Diagnosis Date   Asthma    GERD (gastroesophageal reflux disease)     Past Surgical History:  Procedure Laterality Date   COLONOSCOPY WITH PROPOFOL N/A 05/10/2019   Procedure: COLONOSCOPY WITH PROPOFOL;  Surgeon: Lucilla Lame, MD;  Location: [facility] ENDOSCOPY;  Service: Endoscopy;  Laterality: N/A;   ESOPHAGOGASTRODUODENOSCOPY (EGD) WITH PROPOFOL N/A 05/10/2019   Procedure: ESOPHAGOGASTRODUODENOSCOPY (EGD) WITH PROPOFOL;  Surgeon: Lucilla Lame, MD;  Location: [facility] ENDOSCOPY;  Service: Endoscopy;  Laterality: N/A;   NASAL SINUS SURGERY     SINUS SURGERY WITH INSTATRAK     tubs in ears      There were no vitals filed for this visit.   Subjective Assessment - 02/17/21 0824     Subjective Pt noticed no change to neck Sx after last session. Pt has been practicing his HEP after he puts his kid to bed.. Numbness is still present right now standing.    Pertinent History Family Hx of cancer,  anxiety, stress is related to work , relaxation techniques: playing [place] with his wife, watching car crast videos. Pt has an upcoming appt with PCP to discuss other pain relief options because anti-anxiety medications did not work. Psychotherapy is one of the options to be discussed.    Patient Stated Goals not be in constant pain                 [facility] PT Assessment - 02/17/21 0905       Observation/Other Assessments   Observations noted retraction mm weakness in corner push ups ( protracted shoulders), l;umbar lordosis.   Attempted resistance band exericses but it caused numbness in R hand ( withheld from HEP)      Other:   Other/Comments reaching overhead and behind back: no tingling      Palpation   Spinal mobility tightness at upper trap/ medial scap R. in hooklying:  R sideflexion/ rotation L with R arm at 60 deg from midline, when R arm is overhead above head and elbow bent with L sideflexion no radicular pain RUE but with elbow straight, radicular pain ( medial nn pattern)    Palpation comment no R scapular dyskinesis                           [facility] Adult PT Treatment/Exercise - 02/17/21 0855       Neuro Re-ed    Neuro Re-ed Details  cued for median nn stretch and upper trap stretch for work stretches      Manual Therapy   Manual therapy comments STM/MWM upper trap/medial scap mm attachments with R sideflexion/ rotation L with R arm at 60 deg from midline,  PT Long Term Goals - 02/10/21 0837       PT LONG TERM GOAL #1   Title Pt will demo no R thoracic convex curve and more levelled shoulder, no scapular dyskinesis on R across 2 weeks in order to minimize neck/ shoulder pain and perform work with less numbness    Time 2    Period Weeks    Status Partially Met    Target Date 02/18/21      PT LONG TERM GOAL #2   Title Pt will demo proper deep core coordination to minimize pelvic pain    Time 6    Period Weeks    Status On-going    Target Date 03/18/21      PT LONG TERM GOAL #3   Title Pt will improve Neck FOTO score by > 5 pts from 57 pts in order to improve QOL    Time 10    Period Weeks    Status On-going    Target Date 04/15/21      PT LONG TERM GOAL #4   Title Pt will improve FOTO pelvic        pain score from 8 pt by a  > 2 pt change in order to urinate with normal stream instead of narrow stream and less frequency.    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    Time 8    Period Weeks    Status New    Target Date 04/07/21                   Plan - 02/17/21 0825     Clinical Impression Statement Pt arrived 23 min late and thus, session was abbreviated. Pt showed not more deviated spinal segments nor scapular dyskinesis on R today.  Focused on median nn glides and minimizing upper trap and medial scapular mm tightness on R with manual Tx.   Retraction mm weakness noted in corner push ups ( protracted shoulders), lumbar lordosis. Pt required cues for less lumbar lordosis. No radicular pain with this exercise and will help with scapular thoracoc strengthening.   Attempted resistance band exericses but it caused numbness in R hand ( withheld from HEP).  Median nn glides were effective in minimizing radicular pain from 3/10 to 2/10 today post Tx. 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 Plan to progress with deep core and assess pelvic floor at next session even though pt reports pelvic pain has improved.  Pt will need to train deep core with deep posterior back and neck muscles. Pt continues to  benefit from skilled PT.    Examination-Activity Limitations Sleep;Sit;Locomotion Level;Stand  Stability/Clinical Decision Making Evolving/Moderate complexity    Rehab Potential Good    PT Frequency 1x / week    PT Duration Other (comment)   10   PT Treatment/Interventions ADLs/Self Care Home Management;Moist Heat;Functional mobility training;Stair training;Patient/family education;Wheelchair mobility training;Therapeutic exercise;Therapeutic activities;Balance training;Neuromuscular re-education;Gait training;Manual lymph drainage;Manual techniques;Taping;Energy conservation;Dry needling;Scar mobilization;Spinal Manipulations;Joint Manipulations;Traction;Cryotherapy    Consulted and Agree with Plan of Care Patient             Patient will benefit from skilled therapeutic intervention in order to improve the following deficits and impairments:  Decreased activity tolerance, Decreased balance, Decreased scar mobility, Decreased mobility, Decreased strength, Impaired sensation, Postural dysfunction, Impaired flexibility, Improper body mechanics, Hypomobility, Decreased skin integrity, Decreased endurance, Abnormal gait, Increased muscle spasms, Impaired perceived functional ability, Increased fascial restricitons, Decreased range of motion, Decreased safety awareness, Pain, Difficulty walking, Decreased coordination  Visit Diagnosis: Other lack of coordination  Neck pain  Pelvic pain  Left hip pain  Other abnormalities of gait and mobility  Acute pain of right knee     Problem List Patient Active Problem List   Diagnosis Date Noted   Status post laparoscopic cholecystectomy 11/21/2019   Hematochezia    Polyp of sigmoid colon    Gastroesophageal reflux disease without esophagitis 11/12/2018   Glucosuria 11/12/2018   Mild persistent asthma without complication 11/24/4860    Jerl Mina, PT 02/17/2021, 9:08 AM  [place], [place], [address] Phone: [phone]   Fax:   [phone]  Name: Jedediah Noda MRN: [id number] Date of Birth: 1975/10/18

## 2021-02-24 ENCOUNTER — Ambulatory Visit: Payer: BC Managed Care – PPO | Admitting: Physical Therapy

## 2021-02-24 ENCOUNTER — Other Ambulatory Visit: Payer: Self-pay

## 2021-02-24 DIAGNOSIS — R293 Abnormal posture: Secondary | ICD-10-CM | POA: Diagnosis not present

## 2021-02-24 DIAGNOSIS — R278 Other lack of coordination: Secondary | ICD-10-CM

## 2021-02-24 DIAGNOSIS — R102 Pelvic and perineal pain: Secondary | ICD-10-CM

## 2021-02-24 DIAGNOSIS — M542 Cervicalgia: Secondary | ICD-10-CM

## 2021-02-24 DIAGNOSIS — R2689 Other abnormalities of gait and mobility: Secondary | ICD-10-CM

## 2021-02-24 DIAGNOSIS — M25552 Pain in left hip: Secondary | ICD-10-CM

## 2021-02-24 NOTE — Patient Instructions (Signed)
° ° °  Reclined twist   Lay on your back, knees bend Scoot hips to the R , leave shoulders in place Wobble knees to the L side\and then  resting onto pillows to keep leg at the same width of hips Pillow under L thigh to minimize too much strain

## 2021-02-24 NOTE — Therapy (Signed)
[place] MAIN [facility] SERVICES [address] [place], [place], [address] Phone: [phone]   Fax:  [phone]  Physical Therapy Treatment  Patient Details  Name: Jason Petersen MRN: [id number] Date of Birth: 05/09/1975 Referring Provider (PT): BrandonMD   Encounter Date: 02/24/2021   PT End of Session - 02/24/21 0852     Visit Number 4    Number of Visits 10    Date for PT Re-Evaluation 04/14/21    PT Start Time 0817    PT Stop Time 0900    PT Time Calculation (min) 43 min    Activity Tolerance Patient tolerated treatment well    Behavior During Therapy [facility] for tasks assessed/performed             Past Medical History:  Diagnosis Date   Asthma    GERD (gastroesophageal reflux disease)     Past Surgical History:  Procedure Laterality Date   COLONOSCOPY WITH PROPOFOL N/A 05/10/2019   Procedure: COLONOSCOPY WITH PROPOFOL;  Surgeon: Lucilla Lame, MD;  Location: [facility] ENDOSCOPY;  Service: Endoscopy;  Laterality: N/A;   ESOPHAGOGASTRODUODENOSCOPY (EGD) WITH PROPOFOL N/A 05/10/2019   Procedure: ESOPHAGOGASTRODUODENOSCOPY (EGD) WITH PROPOFOL;  Surgeon: Lucilla Lame, MD;  Location: [facility] ENDOSCOPY;  Service: Endoscopy;  Laterality: N/A;   NASAL SINUS SURGERY     SINUS SURGERY WITH INSTATRAK     tubs in ears      There were no vitals filed for this visit.   Subjective Assessment - 02/24/21 0824     Subjective Pt reports his neck is better with less soreness. Still locks up every know and then. Numbness has not gone away. Neck itself is better by 20%. Exercises are helping.  Pt feels what was done to his R arm  at last session really helped. It was not burning nor tingling for for 5 days.    Pertinent History Family Hx of cancer,  anxiety, stress is related to work , relaxation techniques: playing [place] with his wife, watching car crast videos. Pt has an upcoming appt with PCP to discuss other pain relief options because anti-anxiety  medications did not work. Psychotherapy is one of the options to be discussed.    Patient Stated Goals not be in constant pain                [facility] PT Assessment - 02/24/21 0857       Coordination   Coordination and Movement Description improved scap retraction, less forward head      Palpation   Spinal mobility T3 segment deivated to Lwith tenderness, tightness along interspinals lumbar mm by lower ribs, rhomboids R                           [facility] Adult PT Treatment/Exercise - 02/24/21 1093       Neuro Re-ed    Neuro Re-ed Details  cued for median nn stretch and cervical / scapular retraction      Manual Therapy   Manual therapy comments STM/MWM at rhomboids/ lumbar mm, medial glide at L T3 to promote medial alignment and mm mobility, nn glides for median nn                          PT Long Term Goals - 02/10/21 2355       PT LONG TERM GOAL #1   Title Pt will demo no R thoracic convex curve  and more levelled shoulder, no scapular dyskinesis on R across 2 weeks in order to minimize neck/ shoulder pain and perform work with less numbness    Time 2    Period Weeks    Status Partially Met    Target Date 02/18/21      PT LONG TERM GOAL #2   Title Pt will demo proper deep core coordination to minimize pelvic pain    Time 6    Period Weeks    Status On-going    Target Date 03/18/21      PT LONG TERM GOAL #3   Title Pt will improve Neck FOTO score by > 5 pts from 57 pts in order to improve QOL    Time 10    Period Weeks    Status On-going    Target Date 04/15/21      PT LONG TERM GOAL #4   Title Pt will improve FOTO pelvic        pain score from 8 pt by a > 2 pt change in order to urinate with normal stream instead of narrow stream and less frequency.    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    Time 8    Period Weeks    Status New    Target Date 04/07/21                   Plan - 02/24/21 6269     Clinical Impression Statement Pt showed improved head position with less forward head and reported decreased burning and tingling on RUE after last session.  Today, pt reported decreased numbness in RUE from 6/10 to 0/10 after manual Tx. Segment T3 was deviated to the L and was tenderness prior to Tx.  Lumbar mm were also addressed and discussed turning spine with feet first when sitting on stool at work and to perform lumbar stretches added to HEP.   Pt continues to benefit from skilled PT.   Examination-Activity Limitations Sleep;Sit;Locomotion Level;Stand    Stability/Clinical Decision Making Evolving/Moderate complexity    Rehab Potential Good    PT Frequency 1x / week    PT Duration Other (comment)   10   PT Treatment/Interventions ADLs/Self Care Home Management;Moist Heat;Functional mobility training;Stair training;Patient/family education;Wheelchair mobility training;Therapeutic exercise;Therapeutic activities;Balance training;Neuromuscular re-education;Gait training;Manual lymph drainage;Manual techniques;Taping;Energy conservation;Dry needling;Scar mobilization;Spinal Manipulations;Joint Manipulations;Traction;Cryotherapy    Consulted and Agree with Plan of Care Patient             Patient will benefit from skilled therapeutic intervention in order to improve the following deficits and impairments:  Decreased activity tolerance, Decreased balance, Decreased scar mobility, Decreased  mobility, Decreased strength, Impaired sensation, Postural dysfunction, Impaired flexibility, Improper body mechanics, Hypomobility, Decreased skin integrity, Decreased endurance, Abnormal gait,  Increased muscle spasms, Impaired perceived functional ability, Increased fascial restricitons, Decreased range of motion, Decreased safety awareness, Pain, Difficulty walking, Decreased coordination  Visit Diagnosis: Neck pain  Other lack of coordination  Pelvic pain  Left hip pain  Other abnormalities of gait and mobility     Problem List Patient Active Problem List   Diagnosis Date Noted   Status post laparoscopic cholecystectomy 11/21/2019   Hematochezia    Polyp of sigmoid colon    Gastroesophageal reflux disease without esophagitis 11/12/2018   Glucosuria 11/12/2018   Mild persistent asthma without complication 02/21/2409    Jerl Mina, PT 02/24/2021, 9:00 AM  [place], [place], [address] Phone: [phone]   Fax:  [phone]  Name: Jason Petersen MRN: [id number] Date of Birth: 09/11/1975

## 2021-03-03 ENCOUNTER — Other Ambulatory Visit: Payer: Self-pay

## 2021-03-03 ENCOUNTER — Ambulatory Visit: Payer: BC Managed Care – PPO | Attending: Urology | Admitting: Physical Therapy

## 2021-03-03 DIAGNOSIS — M542 Cervicalgia: Secondary | ICD-10-CM | POA: Insufficient documentation

## 2021-03-03 DIAGNOSIS — M25552 Pain in left hip: Secondary | ICD-10-CM | POA: Insufficient documentation

## 2021-03-03 DIAGNOSIS — M533 Sacrococcygeal disorders, not elsewhere classified: Secondary | ICD-10-CM | POA: Diagnosis not present

## 2021-03-03 DIAGNOSIS — R2689 Other abnormalities of gait and mobility: Secondary | ICD-10-CM | POA: Diagnosis present

## 2021-03-03 DIAGNOSIS — R278 Other lack of coordination: Secondary | ICD-10-CM | POA: Insufficient documentation

## 2021-03-03 DIAGNOSIS — R102 Pelvic and perineal pain: Secondary | ICD-10-CM | POA: Insufficient documentation

## 2021-03-03 NOTE — Patient Instructions (Signed)
Pulling the pasta: Chin tuck the whole time  Elbows by ribs at 90 deg,  Hold band with thumbs out,   Inhale  Exhale pull apart with elbows still against ribs 30 reps

## 2021-03-03 NOTE — Therapy (Signed)
[place] MAIN [facility] SERVICES [address] [place], [place], [address] Phone: [phone]   Fax:  [phone]  Physical Therapy Treatment  Patient Details  Name: Jason Petersen MRN: [id number] Date of Birth: 1975-05-27 Referring Provider (PT): BrandonMD   Encounter Date: 03/03/2021   PT End of Session - 03/03/21 0903     Visit Number 5    Number of Visits 10    Date for PT Re-Evaluation 04/14/21    PT Start Time 0822    PT Stop Time 0900    PT Time Calculation (min) 38 min    Activity Tolerance Patient tolerated treatment well    Behavior During Therapy [facility] for tasks assessed/performed             Past Medical History:  Diagnosis Date   Asthma    GERD (gastroesophageal reflux disease)     Past Surgical History:  Procedure Laterality Date   COLONOSCOPY WITH PROPOFOL N/A 05/10/2019   Procedure: COLONOSCOPY WITH PROPOFOL;  Surgeon: Lucilla Lame, MD;  Location: [facility] ENDOSCOPY;  Service: Endoscopy;  Laterality: N/A;   ESOPHAGOGASTRODUODENOSCOPY (EGD) WITH PROPOFOL N/A 05/10/2019   Procedure: ESOPHAGOGASTRODUODENOSCOPY (EGD) WITH PROPOFOL;  Surgeon: Lucilla Lame, MD;  Location: [facility] ENDOSCOPY;  Service: Endoscopy;  Laterality: N/A;   NASAL SINUS SURGERY     SINUS SURGERY WITH INSTATRAK     tubs in ears      There were no vitals filed for this visit.   Subjective Assessment - 03/03/21 0825     Subjective Pt reports his stress levels went up last week related to a family news.  Pt did not slept well. After last session, pt reported he felt pretty good and felt no more  tingling in his arms and hands for the last week. It is minor now and he feels more muscles fatigue. Pelvic pain is not bothering him.    Pertinent History Family Hx of cancer,  anxiety, stress is related to work , relaxation techniques: playing [place] with his wife, watching car crast videos. Pt has an upcoming appt with PCP to discuss other pain relief options because  anti-anxiety medications did not work. Psychotherapy is one of the options to be discussed.    Patient Stated Goals not be in constant pain                [facility] PT Assessment - 03/03/21 0851       Other:   Other/Comments L sideflexion in supine , no tingling      AROM   Overall AROM Comments 35 deg B sideflexion after Tx cervical      Palpation   Palpation comment no deviated T segments, hypomobile T1 -3 R                           [facility] Adult PT Treatment/Exercise - 03/03/21 1740       Neuro Re-ed    Neuro Re-ed Details  cued for prone and standing cervical/scapular retraction HEP , added red band to do strengthening at work in upright positions      Modalities   Modalities Moist Heat      Moist Heat Therapy   Number Minutes Moist Heat 5 Minutes    Moist Heat Location Cervical      Manual Therapy   Manual therapy comments STM/MWM medial glide at T1-3 to promote mobility  PT Long Term Goals - 02/10/21 0837       PT LONG TERM GOAL #1   Title Pt will demo no R thoracic convex curve and more levelled shoulder, no scapular dyskinesis on R across 2 weeks in order to minimize neck/ shoulder pain and perform work with less numbness    Time 2    Period Weeks    Status Partially Met    Target Date 02/18/21      PT LONG TERM GOAL #2   Title Pt will demo proper deep core coordination to minimize pelvic pain    Time 6    Period Weeks    Status On-going    Target Date 03/18/21      PT LONG TERM GOAL #3   Title Pt will improve Neck FOTO score by > 5 pts from 57 pts in order to improve QOL    Time 10    Period Weeks    Status On-going    Target Date 04/15/21      PT LONG TERM GOAL #4   Title Pt will improve FOTO pelvic        pain score from 8 pt by a > 2 pt change in order to urinate with normal stream instead of narrow stream and less frequency.    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    Time 8    Period Weeks    Status New    Target Date 04/07/21                   Plan - 03/03/21 0942     Clinical Impression Statement Pt arrived 20 min. Session was abbreviated. Pt reports no more numbness/ tingling in his arms since last session. Pt also reports his pelvic pain has not been bothering him  Pt required further manual Tx to mobility upper thoracic segments. There was no more deviated segments which was addressed last session and contributed to his report of resolved N/T in his arms. Pt today did not show report any N/T with head in L sideflexion which was a concordant sign in past Tx.  Added more cervical / scapular HEP with red band in upright position to perform at work. Provided cues for proper technique in modified low cobra. Pt demo'd proper form post Tx. Advance strengthening exercises at next session. Pt continues to benefit from skilled PT   Examination-Activity Limitations Sleep;Sit;Locomotion Level;Stand    Stability/Clinical Decision Making Evolving/Moderate complexity    Rehab Potential Good    PT Frequency 1x / week    PT Duration Other (comment)   10   PT Treatment/Interventions ADLs/Self Care Home Management;Moist Heat;Functional mobility training;Stair training;Patient/family education;Wheelchair mobility training;Therapeutic exercise;Therapeutic activities;Balance training;Neuromuscular re-education;Gait training;Manual lymph drainage;Manual  techniques;Taping;Energy  conservation;Dry needling;Scar mobilization;Spinal Manipulations;Joint Manipulations;Traction;Cryotherapy    Consulted and Agree with Plan of Care Patient             Patient will benefit from skilled therapeutic intervention in order to improve the following deficits and impairments:  Decreased activity tolerance, Decreased balance, Decreased scar mobility, Decreased mobility, Decreased strength, Impaired sensation, Postural dysfunction, Impaired flexibility, Improper body mechanics, Hypomobility, Decreased skin integrity, Decreased endurance, Abnormal gait, Increased muscle spasms, Impaired perceived functional ability, Increased fascial restricitons, Decreased range of motion, Decreased safety awareness, Pain, Difficulty walking, Decreased coordination  Visit Diagnosis: Neck pain  Other lack of coordination  Pelvic pain  Left hip pain  Other abnormalities of gait and mobility     Problem List Patient Active Problem List   Diagnosis Date Noted   Status post laparoscopic cholecystectomy 11/21/2019   Hematochezia    Polyp of sigmoid colon    Gastroesophageal reflux disease without esophagitis 11/12/2018   Glucosuria 11/12/2018   Mild persistent asthma without complication 74/10/7962    Jerl Mina, PT 03/03/2021, 9:42 AM  [place] [address] [place], [place], [address] Phone: [phone]   Fax:  [phone]  Name: Jason Petersen MRN: [id number] Date of Birth: 1975-11-10

## 2021-03-10 ENCOUNTER — Ambulatory Visit: Payer: BC Managed Care – PPO | Admitting: Physical Therapy

## 2021-03-10 ENCOUNTER — Other Ambulatory Visit: Payer: Self-pay

## 2021-03-10 DIAGNOSIS — M25552 Pain in left hip: Secondary | ICD-10-CM

## 2021-03-10 DIAGNOSIS — M542 Cervicalgia: Secondary | ICD-10-CM

## 2021-03-10 DIAGNOSIS — R278 Other lack of coordination: Secondary | ICD-10-CM

## 2021-03-10 DIAGNOSIS — R102 Pelvic and perineal pain: Secondary | ICD-10-CM

## 2021-03-10 NOTE — Therapy (Signed)
[place] MAIN [facility] SERVICES [address] [place], [place], [address] Phone: [phone]   Fax:  [phone]  Physical Therapy Treatment  Patient Details  Name: Jason Petersen MRN: [id number] Date of Birth: 22-Mar-1975 Referring Provider (PT): BrandonMD   Encounter Date: 03/10/2021   PT End of Session - 03/10/21 1226     Visit Number 6    Number of Visits 10    Date for PT Re-Evaluation 04/14/21    PT Start Time 0821    PT Stop Time 0900    PT Time Calculation (min) 39 min    Activity Tolerance Patient tolerated treatment well    Behavior During Therapy [facility] for tasks assessed/performed             Past Medical History:  Diagnosis Date   Asthma    GERD (gastroesophageal reflux disease)     Past Surgical History:  Procedure Laterality Date   COLONOSCOPY WITH PROPOFOL N/A 05/10/2019   Procedure: COLONOSCOPY WITH PROPOFOL;  Surgeon: Lucilla Lame, MD;  Location: [facility] ENDOSCOPY;  Service: Endoscopy;  Laterality: N/A;   ESOPHAGOGASTRODUODENOSCOPY (EGD) WITH PROPOFOL N/A 05/10/2019   Procedure: ESOPHAGOGASTRODUODENOSCOPY (EGD) WITH PROPOFOL;  Surgeon: Lucilla Lame, MD;  Location: [facility] ENDOSCOPY;  Service: Endoscopy;  Laterality: N/A;   NASAL SINUS SURGERY     SINUS SURGERY WITH INSTATRAK     tubs in ears      There were no vitals filed for this visit.   Subjective Assessment - 03/10/21 0825     Subjective Pt reports R arm pain numbness is worse again. Pt was not able to take breaks this past week at work. Pt feels his neck is better but the pain is in his shoulders and midback and arm    Pertinent History Family Hx of cancer,  anxiety, stress is related to work , relaxation techniques: playing [place] with his wife, watching car crast videos. Pt has an upcoming appt with PCP to discuss other pain relief options because anti-anxiety medications did not work. Psychotherapy is one of the options to be discussed.    Patient Stated Goals  not be in constant pain                [facility] PT Assessment - 03/10/21 0826       Observation/Other Assessments   Observations L shoulder slightly higher      AROM   Overall AROM Comments 35 deg B sideflexion  in seated position      Palpation   Spinal mobility T3 deviated to the L,                           [facility] Adult PT Treatment/Exercise - 03/10/21 1240       Neuro Re-ed    Neuro Re-ed Details  cued for prone HEP and trialed resistance band hooklying scapular stabilization ex but withheld it due to numbness in hand R      Modalities   Modalities Moist Heat      Moist Heat Therapy   Number Minutes Moist Heat 5 Minutes    Moist Heat Location Cervical      Manual Therapy   Manual therapy comments STM/MWM medial glide at T3 to promote mobility                          PT Long Term Goals - 03/10/21 1334  PT LONG TERM GOAL #1   Title Pt will demo no R thoracic convex curve and more levelled shoulder, no scapular dyskinesis on R across 2 weeks in order to minimize neck/ shoulder pain and perform work with less numbness    Time 2    Period Weeks    Status Partially Met    Target Date 02/18/21      PT LONG TERM GOAL #2   Title Pt will demo proper deep core coordination to minimize pelvic pain    Time 6    Period Weeks    Status On-going    Target Date 03/18/21      PT LONG TERM GOAL #3   Title Pt will improve Neck FOTO score by > 5 pts from 57 pts in order to improve QOL    Time 10    Period Weeks    Status On-going    Target Date 04/15/21      PT LONG TERM GOAL #4   Title Pt will improve FOTO pelvic        pain score from 8 pt by a > 2 pt change in order to urinate with normal stream instead of narrow stream and less frequency.    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    Time 8    Period Weeks    Status New    Target Date 04/07/21                   Plan - 03/10/21 1226     Clinical Impression Statement Pt arrived 20 min, session was abbreviated. Pt continued to show deviated T 3 segment today. Pt reported last week he was not able to perofrm his HEP at work to counteract the repeated motion of his work tasks. L shoulder was higher than R. Provided manual Tx which realigned T3. Median nn glide helped with numbness in R arm. Trialed resistance band strengthening in hooklying for cervicoscapular strengthening but it cuased tingling in R hand ( thumb, index/ middle finger ( median nerve pattern)  . Suspect further manual Tx is needed to address wrist and carpal bones ( distal portionof medial nn). Pt uses microscope all day at work. Plan to ask about hand positions and hand use at work. Pt continues to benefit from skilled PT.    Examination-Activity Limitations Sleep;Sit;Locomotion Level;Stand    Stability/Clinical Decision Making Evolving/Moderate complexity    Rehab Potential Good    PT Frequency 1x / week    PT Duration Other (comment)   10   PT Treatment/Interventions ADLs/Self Care Home Management;Moist Heat;Functional mobility training;Stair training;Patient/family education;Wheelchair mobility training;Therapeutic exercise;Therapeutic activities;Balance training;Neuromuscular re-education;Gait training;Manual lymph drainage;Manual techniques;Taping;Energy conservation;Dry needling;Scar mobilization;Spinal Manipulations;Joint Manipulations;Traction;Cryotherapy    Consulted and Agree with Plan of Care Patient             Patient  will benefit  from skilled therapeutic intervention in order to improve the following deficits and impairments:  Decreased activity tolerance, Decreased balance, Decreased scar mobility, Decreased mobility, Decreased strength, Impaired sensation, Postural dysfunction, Impaired flexibility, Improper body mechanics, Hypomobility, Decreased skin integrity, Decreased endurance, Abnormal gait, Increased muscle spasms, Impaired perceived functional ability, Increased fascial restricitons, Decreased range of motion, Decreased safety awareness, Pain, Difficulty walking, Decreased coordination  Visit Diagnosis: Other lack of coordination  Pelvic pain  Left hip pain  Neck pain     Problem List Patient Active Problem List   Diagnosis Date Noted   Status post laparoscopic cholecystectomy 11/21/2019   Hematochezia    Polyp of sigmoid colon    Gastroesophageal reflux disease without esophagitis 11/12/2018   Glucosuria 11/12/2018   Mild persistent asthma without complication 92/01/69    Jerl Mina, PT 03/10/2021, 1:34 PM  [place] [address] [place], [place], [address] Phone: [phone]   Fax:  [phone]  Name: Sueo Cullen MRN: [id number] Date of Birth: 06/11/1975

## 2021-03-17 ENCOUNTER — Other Ambulatory Visit: Payer: Self-pay

## 2021-03-17 ENCOUNTER — Ambulatory Visit: Payer: BC Managed Care – PPO | Admitting: Physical Therapy

## 2021-03-17 DIAGNOSIS — M25552 Pain in left hip: Secondary | ICD-10-CM

## 2021-03-17 DIAGNOSIS — M542 Cervicalgia: Secondary | ICD-10-CM | POA: Diagnosis not present

## 2021-03-17 DIAGNOSIS — R102 Pelvic and perineal pain: Secondary | ICD-10-CM

## 2021-03-17 DIAGNOSIS — R2689 Other abnormalities of gait and mobility: Secondary | ICD-10-CM

## 2021-03-17 DIAGNOSIS — R278 Other lack of coordination: Secondary | ICD-10-CM

## 2021-03-17 NOTE — Therapy (Signed)
[place] MAIN [facility] SERVICES [address] [place], [place], [address] Phone: [phone]   Fax:  [phone]  Physical Therapy Treatment  Patient Details  Name: Jason Petersen MRN: [id number] Date of Birth: 06-27-1975 Referring Provider (PT): BrandonMD   Encounter Date: 03/17/2021   PT End of Session - 03/17/21 0859     Visit Number 7    Number of Visits 10    Date for PT Re-Evaluation 04/14/21    PT Start Time 0812    PT Stop Time 0900    PT Time Calculation (min) 48 min    Activity Tolerance Patient tolerated treatment well    Behavior During Therapy [facility] for tasks assessed/performed             Past Medical History:  Diagnosis Date   Asthma    GERD (gastroesophageal reflux disease)     Past Surgical History:  Procedure Laterality Date   COLONOSCOPY WITH PROPOFOL N/A 05/10/2019   Procedure: COLONOSCOPY WITH PROPOFOL;  Surgeon: Lucilla Lame, MD;  Location: [facility] ENDOSCOPY;  Service: Endoscopy;  Laterality: N/A;   ESOPHAGOGASTRODUODENOSCOPY (EGD) WITH PROPOFOL N/A 05/10/2019   Procedure: ESOPHAGOGASTRODUODENOSCOPY (EGD) WITH PROPOFOL;  Surgeon: Lucilla Lame, MD;  Location: [facility] ENDOSCOPY;  Service: Endoscopy;  Laterality: N/A;   NASAL SINUS SURGERY     SINUS SURGERY WITH INSTATRAK     tubs in ears      There were no vitals filed for this visit.   Subjective Assessment - 03/17/21 0814     Subjective Pt reports the R arm pain has been about the same but the spine feels better. Pt had one ok week. Pt feel sit is all the muscle tension wrapped about nerves. The tingling goes to the wrist on the R to the pinky finger. Today he does not feel tingling at the last 3 fingres today. It came on and off for the last 20 years. Pt had to do a repeated task of opening cassettes which is the motion of opening a jar. Thumb feels sore.    Pertinent History Family Hx of cancer,  anxiety, stress is related to work , relaxation techniques: playing  [place] with his wife, watching car crast videos. Pt has an upcoming appt with PCP to discuss other pain relief options because anti-anxiety medications did not work. Psychotherapy is one of the options to be discussed.    Patient Stated Goals not be in constant pain                [facility] PT Assessment - 03/17/21 0817       Observation/Other Assessments   Observations levelled shoulder      AROM   Overall AROM Comments radial deviation 15 deg, R, 20 deg L, Wrist ext R 50 deg, 70 deg, ( post Tx: wrist B 70 deg, radial deviation 20 deg R  )      Palpation   Spinal mobility T 3 no longer deviated    Palpation comment hypomobile carpal bones at hamate, trapezoid mostly hypomobile, triquetrum . lunate, supinator mm, abductor pollucis longus. extensor abductor pollucis                           [facility] Adult PT Treatment/Exercise - 03/17/21 0857       Therapeutic Activites    Other Therapeutic Activities explained complimentary stretches at wrist and elbows to decompress nerve and mobilize carpal bones into extension and radial deviation  Neuro Re-ed    Neuro Re-ed Details  cued for stretches at wrist/ elbow to counteract repeated task at microscope knob      Modalities   Modalities Moist Heat      Moist Heat Therapy   Number Minutes Moist Heat 5 Minutes    Moist Heat Location --   wrist/ arm     Manual Therapy   Manual therapy comments distraction, PA/AP mob at carpal bones that were hypombobile noted in assessment, STM/MWM at mm that were tight noted in assessment                          PT Long Term Goals - 03/10/21 1334       PT LONG TERM GOAL #1   Title Pt will demo no R thoracic convex curve and more levelled shoulder, no scapular dyskinesis on R across 2 weeks in order to minimize neck/ shoulder pain and perform work with less numbness    Time 2    Period Weeks    Status Partially Met    Target Date 02/18/21      PT LONG  TERM GOAL #2   Title Pt will demo proper deep core coordination to minimize pelvic pain    Time 6    Period Weeks    Status On-going    Target Date 03/18/21      PT LONG TERM GOAL #3   Title Pt will improve Neck FOTO score by > 5 pts from 57 pts in order to improve QOL    Time 10    Period Weeks    Status On-going    Target Date 04/15/21      PT LONG TERM GOAL #4   Title Pt will improve FOTO pelvic        pain score from 8 pt by a > 2 pt change in order to urinate with normal stream instead of narrow stream and less frequency.    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    Time 8    Period Weeks    Status New    Target Date 04/07/21                   Plan - 03/17/21 0900     Clinical Impression Statement Pt had no radiating pain from neck to upper arm today on R. Focused on increasing wrist bone mobility to coutner act turning knob on mircoscope.  Pt regained wrist extension and radial deviation AROM on R hand post Tx. Anticipate less tingling in R hand. Pt reports spinal issues are better. Pain at the R shoulder is from muscle tightness not nerve.  Cued for stretch for lower UE to counteract turn microscope knob. Pt continues to benefit from skilled PT.    Examination-Activity Limitations Sleep;Sit;Locomotion Level;Stand  Stability/Clinical Decision Making Evolving/Moderate complexity    Rehab Potential Good    PT Frequency 1x / week    PT Duration Other (comment)   10   PT Treatment/Interventions ADLs/Self Care Home  Management;Moist Heat;Functional mobility training;Stair training;Patient/family education;Wheelchair mobility training;Therapeutic exercise;Therapeutic activities;Balance training;Neuromuscular re-education;Gait training;Manual lymph drainage;Manual techniques;Taping;Energy conservation;Dry needling;Scar mobilization;Spinal Manipulations;Joint Manipulations;Traction;Cryotherapy    Consulted and Agree with Plan of Care Patient             Patient will benefit from skilled therapeutic intervention in order to improve the following deficits and impairments:  Decreased activity tolerance, Decreased balance, Decreased scar mobility, Decreased mobility, Decreased strength, Impaired sensation, Postural dysfunction, Impaired flexibility, Improper body mechanics, Hypomobility, Decreased skin integrity, Decreased endurance, Abnormal gait, Increased muscle spasms, Impaired perceived functional ability, Increased fascial restricitons, Decreased range of motion, Decreased safety awareness, Pain, Difficulty walking, Decreased coordination  Visit Diagnosis: Other lack of coordination  Pelvic pain  Left hip pain  Neck pain  Other abnormalities of gait and mobility     Problem List Patient Active Problem List   Diagnosis Date Noted   Status post laparoscopic cholecystectomy 11/21/2019   Hematochezia    Polyp of sigmoid colon    Gastroesophageal reflux disease without esophagitis 11/12/2018   Glucosuria 11/12/2018   Mild persistent asthma without complication 16/11/9602    Jerl Mina, PT 03/17/2021, 9:42 AM  [place] [address] [place], [place], [address] Phone: [phone]   Fax:  [phone]  Name: Jason Petersen MRN: [id number] Date of Birth: August 14, 1975

## 2021-03-30 ENCOUNTER — Other Ambulatory Visit: Payer: Self-pay

## 2021-03-30 ENCOUNTER — Ambulatory Visit: Payer: BC Managed Care – PPO | Admitting: Physical Therapy

## 2021-03-30 DIAGNOSIS — R278 Other lack of coordination: Secondary | ICD-10-CM

## 2021-03-30 DIAGNOSIS — M25552 Pain in left hip: Secondary | ICD-10-CM

## 2021-03-30 DIAGNOSIS — M542 Cervicalgia: Secondary | ICD-10-CM

## 2021-03-30 DIAGNOSIS — R102 Pelvic and perineal pain: Secondary | ICD-10-CM

## 2021-03-30 NOTE — Therapy (Signed)
[place] MAIN [facility] SERVICES [address] [place], [place], [address] Phone: [phone]   Fax:  [phone]  Physical Therapy Treatment  Patient Details  Name: Jason Petersen MRN: [id number] Date of Birth: 03/31/1975 Referring Provider (PT): BrandonMD   Encounter Date: 03/30/2021   PT End of Session - 03/30/21 0272     Visit Number 8    Number of Visits 10    Date for PT Re-Evaluation 04/14/21    PT Start Time 0815    PT Stop Time 0842    PT Time Calculation (min) 27 min    Activity Tolerance Patient tolerated treatment well    Behavior During Therapy [facility] for tasks assessed/performed             Past Medical History:  Diagnosis Date   Asthma    GERD (gastroesophageal reflux disease)     Past Surgical History:  Procedure Laterality Date   COLONOSCOPY WITH PROPOFOL N/A 05/10/2019   Procedure: COLONOSCOPY WITH PROPOFOL;  Surgeon: Lucilla Lame, MD;  Location: [facility] ENDOSCOPY;  Service: Endoscopy;  Laterality: N/A;   ESOPHAGOGASTRODUODENOSCOPY (EGD) WITH PROPOFOL N/A 05/10/2019   Procedure: ESOPHAGOGASTRODUODENOSCOPY (EGD) WITH PROPOFOL;  Surgeon: Lucilla Lame, MD;  Location: [facility] ENDOSCOPY;  Service: Endoscopy;  Laterality: N/A;   NASAL SINUS SURGERY     SINUS SURGERY WITH INSTATRAK     tubs in ears      There were no vitals filed for this visit.   Subjective Assessment - 03/30/21 0817     Subjective Pt noticed there has been less numbness in his hands since last session. Neck and shoulder pain is still an issue. Pt noticed pelvic floor being on fire after being constipated and having to strain. Pt is not sure how much he is drinking water and "probably not enough".  Last bowel movement occurred 2 days ago.  Pt started to research about leaking gut syndrome.    Pertinent History Family Hx of cancer,  anxiety, stress is related to work , relaxation techniques: playing [place] with his wife, watching car crast videos. Pt has an  upcoming appt with PCP to discuss other pain relief options because anti-anxiety medications did not work. Psychotherapy is one of the options to be discussed.                               [place] Adult PT Treatment/Exercise - 03/30/21 0857       Therapeutic Activites    Other Therapeutic Activities active listening to pt's interest to learn more about gut health, provided strategies to prevent constipation ( food diary, track water intake, use of squatty potty, optimal diaphramgatic excursion for lengthening pelvic floor to minimzie straining), providered resources and articles on gut health and providers                          PT Long Term Goals - 03/10/21 1334       PT LONG TERM GOAL #1   Title Pt will demo no R thoracic convex curve and more levelled shoulder, no scapular dyskinesis on R across 2 weeks in order to minimize neck/ shoulder pain and perform work with less numbness    Time 2    Period Weeks    Status Partially Met    Target Date 02/18/21      PT LONG TERM GOAL #2   Title Pt will demo  proper deep core coordination to minimize pelvic pain    Time 6    Period Weeks    Status On-going    Target Date 03/18/21      PT LONG TERM GOAL #3   Title Pt will improve Neck FOTO score by > 5 pts from 57 pts in order to improve QOL    Time 10    Period Weeks    Status On-going    Target Date 04/15/21      PT LONG TERM GOAL #4   Title Pt will improve FOTO pelvic        pain score from 8 pt by a > 2 pt change in order to urinate with normal stream instead of narrow stream and less frequency.    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    Time 8    Period Weeks    Status New    Target Date 04/07/21                   Plan - 03/30/21 6333     Clinical Impression Statement Pt noticed there has been less numbness in his hands since last session. Neck and shoulder pain is still an issue. Pt noticed pelvic floor being on fire after being constipated and having to strain.  Provided active listening to pt's interest to learn more about gut health, provided strategies to prevent constipation ( food diary, track water intake, use of squatty potty, optimal diaphramgatic excursion for lengthening pelvic floor to minimzie straining), providered resources and articles on gut health and providers.  Emphasized improtance to be compliant with deep core exercises for peristalsis and motility. Pt voiced understanding.   Pt continues to benefit from skilled PT to address goals.    Examination-Activity Limitations Sleep;Sit;Locomotion Level;Stand    Stability/Clinical Decision Making Evolving/Moderate complexity    Rehab Potential Good    PT Frequency 1x / week    PT Duration Other (comment)   10   PT Treatment/Interventions ADLs/Self Care Home Management;Moist Heat;Functional mobility training;Stair training;Patient/family education;Wheelchair mobility training;Therapeutic exercise;Therapeutic activities;Balance training;Neuromuscular re-education;Gait training;Manual lymph drainage;Manual techniques;Taping;Energy conservation;Dry needling;Scar mobilization;Spinal Manipulations;Joint Manipulations;Traction;Cryotherapy    Consulted and Agree with Plan of Care Patient             Patient will benefit from skilled therapeutic intervention in order to improve the following deficits and impairments:  Decreased activity tolerance, Decreased balance, Decreased scar mobility, Decreased mobility,  Decreased strength, Impaired sensation, Postural dysfunction, Impaired flexibility, Improper body mechanics, Hypomobility, Decreased skin integrity, Decreased endurance, Abnormal gait, Increased muscle spasms, Impaired perceived functional ability, Increased fascial restricitons, Decreased range of motion, Decreased safety awareness, Pain, Difficulty walking, Decreased coordination  Visit Diagnosis: Pelvic pain  Other lack of coordination  Neck pain  Left hip pain     Problem List Patient Active Problem List  Diagnosis Date Noted   Status post laparoscopic cholecystectomy 11/21/2019   Hematochezia    Polyp of sigmoid colon    Gastroesophageal reflux disease without esophagitis 11/12/2018   Glucosuria 11/12/2018   Mild persistent asthma without complication 03/13/1550    Jerl Mina, PT 03/30/2021, 8:59 AM  [place] MAIN [facility] SERVICES [place], [place], [address] Phone: [phone]   Fax:  [phone]  Name: Sebron Mcmahill MRN: [id number] Date of Birth: 1975-03-13

## 2021-04-06 ENCOUNTER — Ambulatory Visit: Payer: BC Managed Care – PPO | Admitting: Physical Therapy

## 2021-04-13 ENCOUNTER — Ambulatory Visit: Payer: BC Managed Care – PPO | Admitting: Physical Therapy

## 2021-05-20 ENCOUNTER — Ambulatory Visit: Payer: BC Managed Care – PPO | Admitting: Physical Therapy

## 2021-06-25 IMAGING — MR MR CERVICAL SPINE W/O CM
5 series · 37 of 48 positions shown · non-contrast
Comparison: None available.

CLINICAL DATA: Initial evaluation for neck pain with radiation into
the posterior head with arm pain and tingling for 8 months.

EXAM:
MRI CERVICAL SPINE WITHOUT CONTRAST
TECHNIQUE: Multiplanar, multisequence MR imaging of the cervical spine was
performed. No intravenous contrast was administered.

[Series 5: T2 · sagittal · 3.0mm · 0.62mm/px · 7 of 15 slices shown (1 of 2)]
[im 1/15]
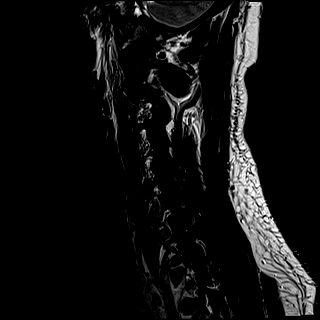
[im 3/15]
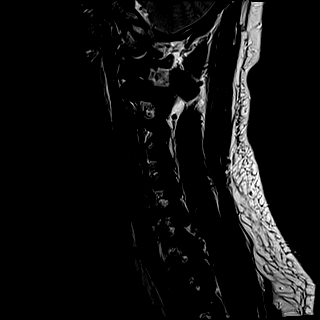
[im 5/15]
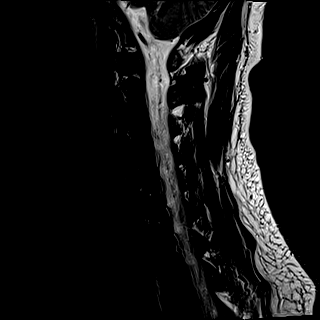
[im 8/15]
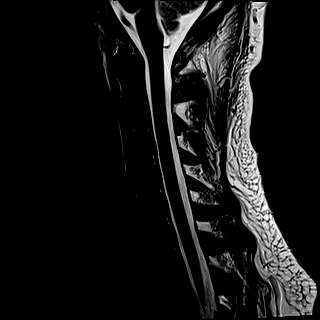
[im 10/15]
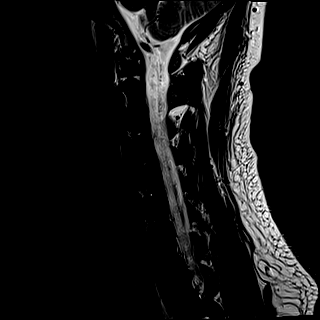
[im 12/15]
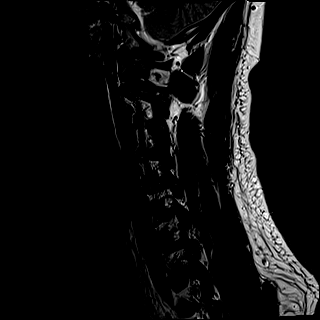
[im 15/15]
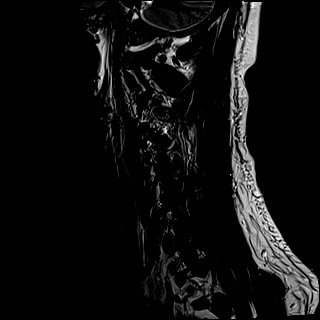

[Series 6: FLAIR · sagittal · 3.0mm · 0.78mm/px · 7 of 15 slices shown]
[im 1/15]
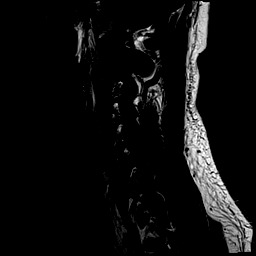
[im 3/15]
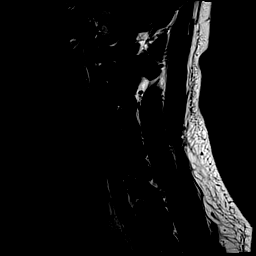
[im 5/15]
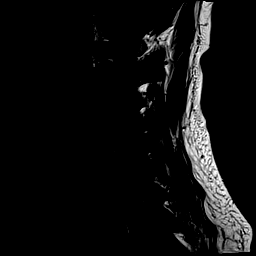
[im 8/15]
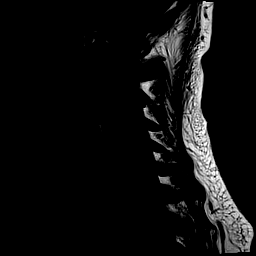
[im 10/15]
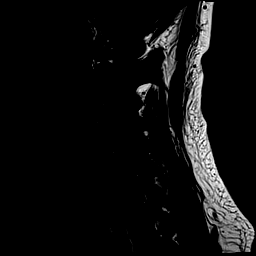
[im 12/15]
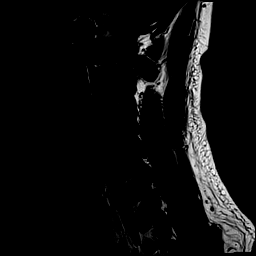
[im 15/15]
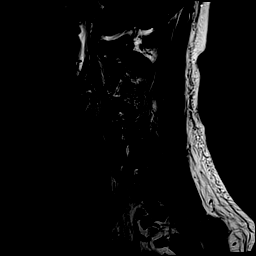

[Series 7: STIR · sagittal · 3.0mm · 0.62mm/px · 6 of 15 slices shown]
[im 1/15]
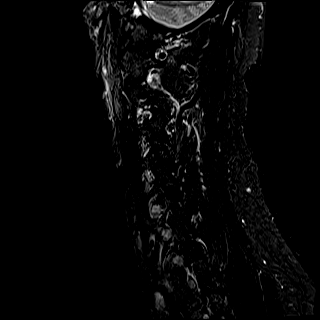
[im 3/15]
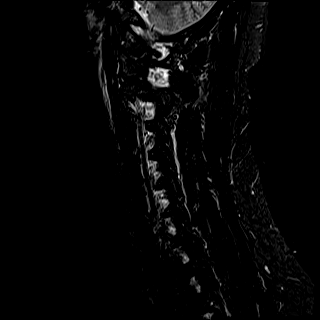
[im 6/15]
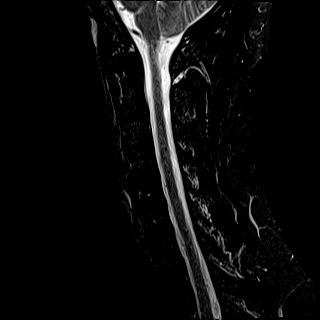
[im 9/15]
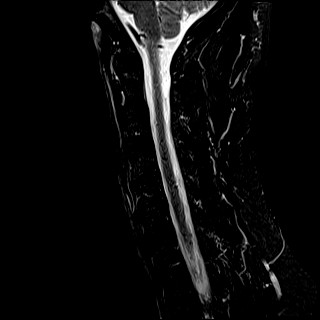
[im 12/15]
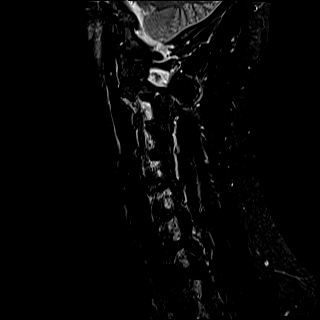
[im 15/15]
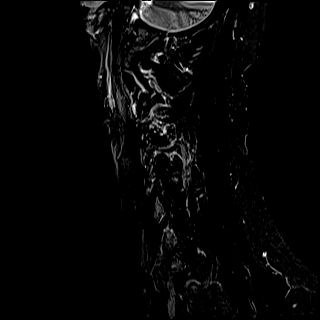

[Series 8: T2 · axial · 3.0mm · 0.70mm/px · z∈[-261,-151]mm · 9 of 33 slices shown (2 of 2)]
[im 1/33]
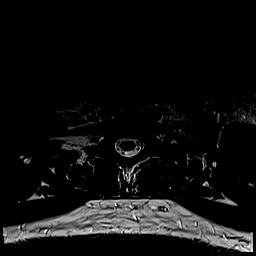
[im 3/33]
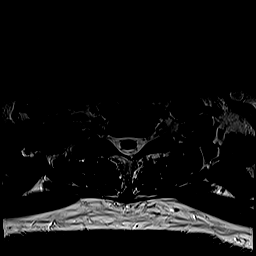
[im 5/33]
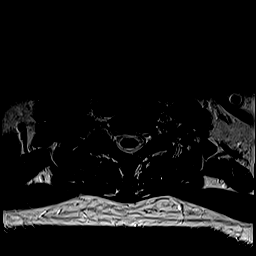
[im 10/33]
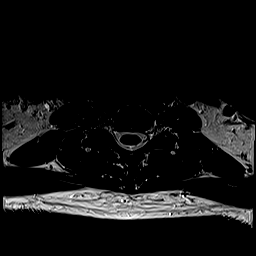
[im 15/33]
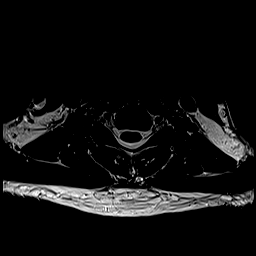
[im 18/33]
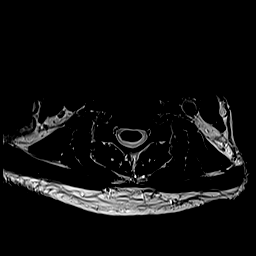
[im 23/33]
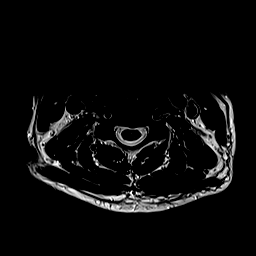
[im 28/33]
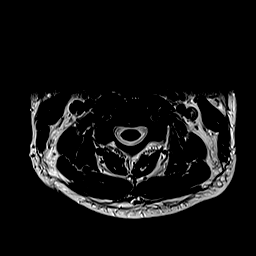
[im 33/33]
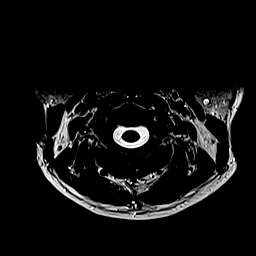

[Series 9: ax mpgr · axial · 3.0mm · 0.35mm/px · z∈[-261,-151]mm · 8 of 33 slices shown]
[im 1/33]
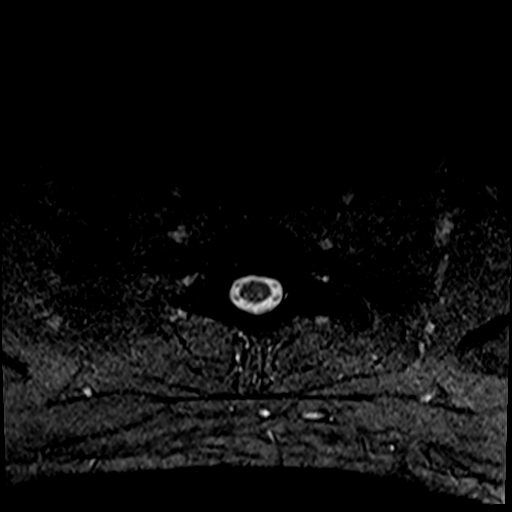
[im 5/33]
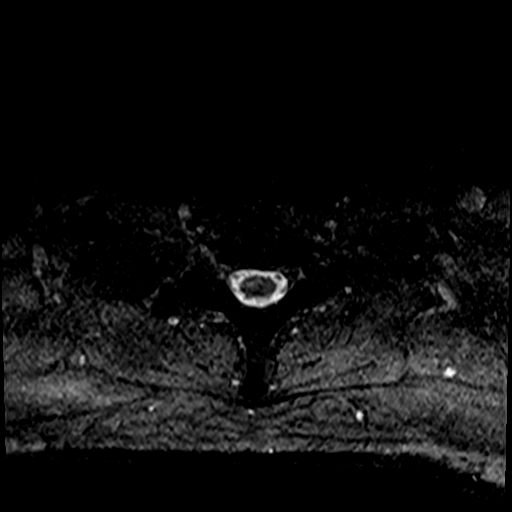
[im 10/33]
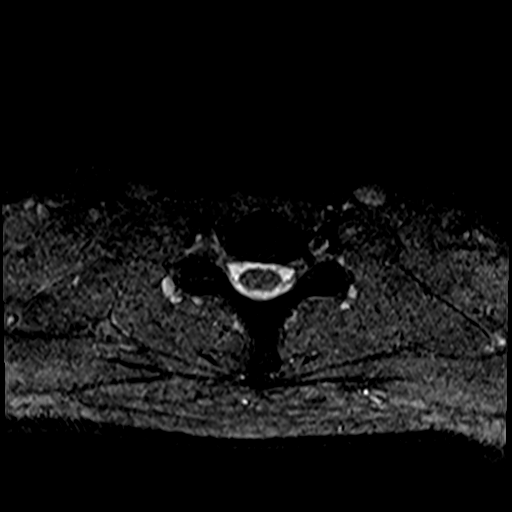
[im 15/33]
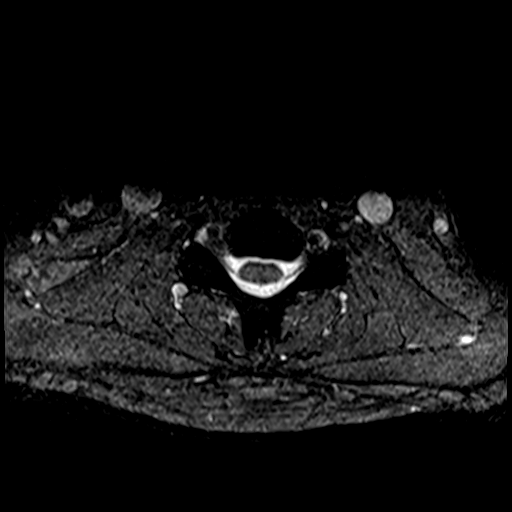
[im 18/33]
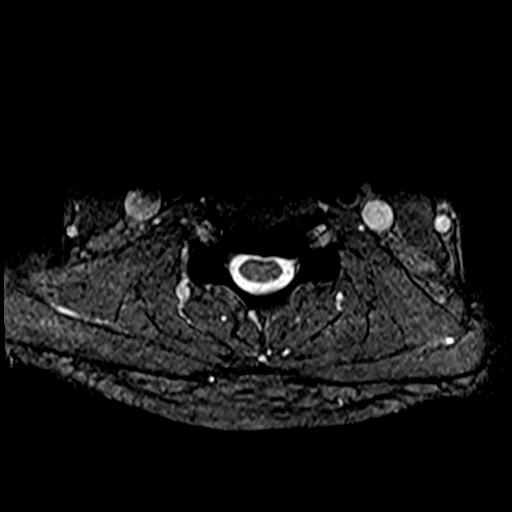
[im 23/33]
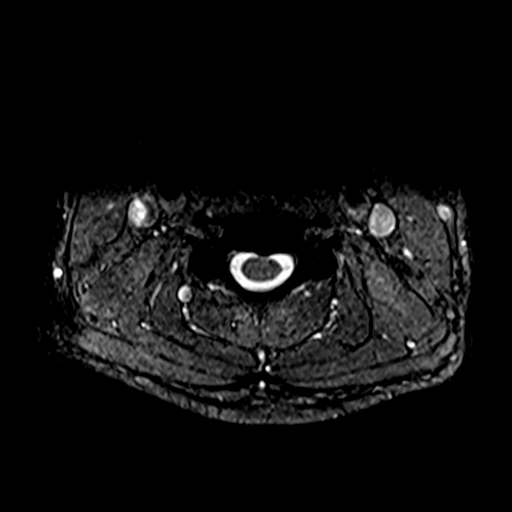
[im 28/33]
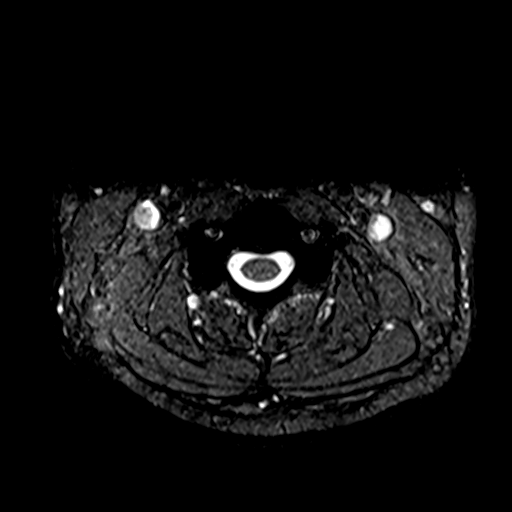
[im 33/33]
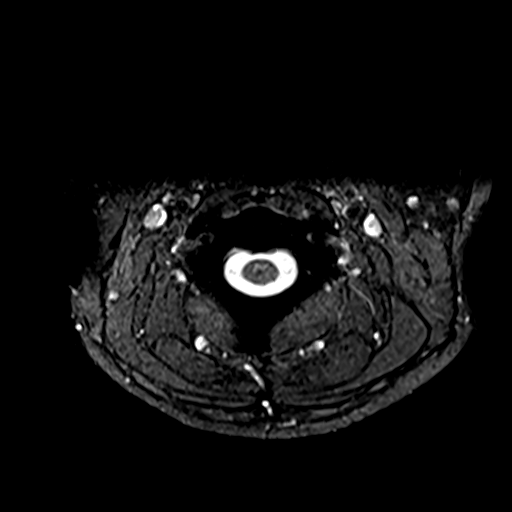

[37 of 48 positions shown; findings below may reference images not displayed]

FINDINGS: Alignment: Straightening of the normal cervical lordosis. No
listhesis.

Vertebrae: Vertebral body height maintained without acute or chronic
fracture. Bone marrow signal intensity diffusely decreased on T1
weighted imaging, nonspecific, but most commonly related to anemia,
smoking, or obesity. No discrete or worrisome osseous lesions. No
abnormal marrow edema.

Cord: Normal signal and morphology.

Posterior Fossa, vertebral arteries, paraspinal tissues: Visualized
brain and posterior fossa within normal limits. Craniocervical
junction normal. Paraspinous and prevertebral soft tissues within
normal limits. Normal intravascular flow voids seen within the
vertebral arteries bilaterally.

Disc levels:

C2-C3: Unremarkable.

C3-C4: Tiny central disc protrusion mildly indents the ventral
thecal sac. No stenosis or cord deformity. Foramina remain patent.

C4-C5: Mild disc bulge with uncovertebral hypertrophy, greater on
the left. No spinal stenosis. Mild left C5 foraminal narrowing.
Right neural foramina remains patent.

C5-C6: Minimal disc bulge with uncovertebral hypertrophy. No canal
or foraminal stenosis.

C6-C7: Minimal annular disc bulge with uncovertebral spurring. No
canal or foraminal stenosis.

C7-T1: Normal interspace. Mild right-sided facet hypertrophy. No
stenosis or impingement.

Visualized upper thoracic spine demonstrates no significant finding.
IMPRESSION: 1. Mild for age multilevel cervical spondylosis as above without
significant spinal stenosis or neural impingement.
2. Mild left C5 foraminal narrowing related to uncovertebral
disease. No other significant foraminal encroachment within the
cervical spine.
3. Diffusely decreased T1 signal intensity throughout the visualized
bone marrow, nonspecific, but most commonly related to anemia,
smoking, or obesity. Correlation with history and laboratory values
suggested.

## 2022-12-13 ENCOUNTER — Ambulatory Visit (INDEPENDENT_AMBULATORY_CARE_PROVIDER_SITE_OTHER): Payer: BC Managed Care – PPO | Admitting: Gastroenterology

## 2022-12-13 ENCOUNTER — Encounter: Payer: Self-pay | Admitting: Gastroenterology

## 2022-12-13 VITALS — BP 129/82 | HR 88 | Temp 98.3°F | Wt 195.0 lb

## 2022-12-13 DIAGNOSIS — K638219 Small intestinal bacterial overgrowth, unspecified: Secondary | ICD-10-CM

## 2022-12-13 DIAGNOSIS — B379 Candidiasis, unspecified: Secondary | ICD-10-CM

## 2022-12-13 DIAGNOSIS — R1310 Dysphagia, unspecified: Secondary | ICD-10-CM

## 2022-12-13 DIAGNOSIS — K219 Gastro-esophageal reflux disease without esophagitis: Secondary | ICD-10-CM

## 2022-12-13 NOTE — Progress Notes (Signed)
Gastroenterology Consultation  Referring Provider:     Kandyce Rud, MD Primary Care Physician:  Jason Rud, MD Primary Gastroenterologist:  Dr. Servando Snare     Reason for Consultation:     GERD        HPI:   Jason Petersen is a 47 y.o. y/o male referred for consultation & management of GERD by Dr. Kandyce Rud, MD. This patient comes to see me after being seen back in 2021 with a EGD and colonoscopy done at that time.  The patient's EGD was normal with the colonoscopy showing a polyp with a recommendation to have a repeat colonoscopy and 5 years due to the polyp being an adenoma.  The patient comes with paperwork today that were labs from his naturopath.  He reports that he was diagnosed with intestinal yeast infection with possible leaky gut syndrome with small intestinal bacterial overgrowth and was told he had possibly developed a hernia.  The patient was also told that a lot of these things may be caused by his PPI.  The patient reports that he was treated for all these things except for the hiatal hernia and reports that he is better but is not back to normal.  He also reports that he has generalized muscle weakness and was informed that his upper endoscopy in 2021 did not show a hiatal hernia.  He states that his father has a sliding hiatal hernia and had informed him that it may be missed during the upper endoscopy.   Past Medical History:  Diagnosis Date   Asthma    GERD (gastroesophageal reflux disease)     Past Surgical History:  Procedure Laterality Date   COLONOSCOPY WITH PROPOFOL N/A 05/10/2019   Procedure: COLONOSCOPY WITH PROPOFOL;  Surgeon: Midge Minium, MD;  Location: [facility] ENDOSCOPY;  Service: Endoscopy;  Laterality: N/A;   ESOPHAGOGASTRODUODENOSCOPY (EGD) WITH PROPOFOL N/A 05/10/2019   Procedure: ESOPHAGOGASTRODUODENOSCOPY (EGD) WITH PROPOFOL;  Surgeon: Midge Minium, MD;  Location: [facility] ENDOSCOPY;  Service: Endoscopy;  Laterality: N/A;   NASAL SINUS SURGERY     SINUS  SURGERY WITH INSTATRAK     tubs in ears      Prior to Admission medications   Medication Sig Start Date End Date Taking? Authorizing Provider  cholecalciferol (VITAMIN D3) 25 MCG (1000 UNIT) tablet Take 1,000 Units by mouth daily.    [provider]  meloxicam (MOBIC) 7.5 MG tablet Take 1 tablet (7.5 mg total) by mouth daily. 10/07/20   Vanna Scotland, MD  mirtazapine (REMERON) 15 MG tablet Take 15 mg by mouth at bedtime. 08/18/20   [provider]  pantoprazole (PROTONIX) 40 MG tablet Take 1 tablet (40 mg total) by mouth daily. 04/22/19   Midge Minium, MD    Family History  Problem Relation Age of Onset   Ovarian cancer Mother    Brain cancer Sister      Social History   Tobacco Use   Smoking status: Never   Smokeless tobacco: Never  Substance Use Topics   Alcohol use: Yes    Comment: occasionally   Drug use: Never    Allergies as of 12/13/2022   (No Known Allergies)    Review of Systems:    All systems reviewed and negative except where noted in HPI.   Physical Exam:  There were no vitals taken for this visit. No LMP for male patient. General:   Alert,  Well-developed, well-nourished, pleasant and cooperative in NAD Head:  Normocephalic and atraumatic. Eyes:  Sclera clear,  no icterus.   Conjunctiva pink. Ears:  Normal auditory acuity. Skin:  Intact without significant lesions or rashes.  No jaundice. Psych:  Alert and cooperative. Normal mood and affect.  Imaging Studies: No results found.  Assessment and Plan:   Jason Petersen is a 47 y.o. y/o male who comes in today with multiple lab results from a naturopath reporting that he had small intestinal bacterial overgrowth diagnosed on a stool test with a report of a GI yeast infection and the patient continues to have symptoms including weakness mid abdominal discomfort and bloating.  The patient will have a upper GI series to look for the development of a hiatal hernia.  He will also be set up for a  SIBO test.  He has been told that if his symptoms were due to his PPI and he would like to get off of the PPI he can always consider antireflux surgery.  The patient has been explained the plan and agrees with it.    Midge Minium, MD. Clementeen Graham    Note: This dictation was prepared with Dragon dictation along with smaller phrase technology. Any transcriptional errors that result from this process are unintentional.

## 2022-12-13 NOTE — Patient Instructions (Addendum)
Swallow test has been scheduled for November 15th at [facility], [facility] entrance, you must arrive at 8:15 AM to register.  You can not have anything to eat or drink after 12 midnight the day before  If you need to cancel or reschedule, please call [phone]

## 2022-12-16 ENCOUNTER — Other Ambulatory Visit: Payer: BC Managed Care – PPO

## 2022-12-21 ENCOUNTER — Telehealth: Payer: Self-pay

## 2022-12-21 NOTE — Telephone Encounter (Signed)
Order form, demographics and insurance card faxed to [email or web address]

## 2022-12-23 ENCOUNTER — Ambulatory Visit
Admission: RE | Admit: 2022-12-23 | Discharge: 2022-12-23 | Disposition: A | Discharge status: Home / Self Care | Payer: BC Managed Care – PPO | Source: Ambulatory Visit | Attending: Gastroenterology | Admitting: Gastroenterology

## 2022-12-23 DIAGNOSIS — R1310 Dysphagia, unspecified: Secondary | ICD-10-CM | POA: Insufficient documentation

## 2022-12-23 DIAGNOSIS — K219 Gastro-esophageal reflux disease without esophagitis: Secondary | ICD-10-CM | POA: Diagnosis present

## 2022-12-27 ENCOUNTER — Encounter: Payer: Self-pay | Admitting: Gastroenterology

## 2023-02-06 NOTE — Telephone Encounter (Signed)
 The barium swallow showed normal anatomy with a normal esophagus and no obstruction with easy passage of the 13 mm tablet.  There is no sign of any reflux during the entire exam.
